# Patient Record
Sex: Male | Born: 1970 | Race: White | Hispanic: No | Marital: Single | Smoking: Never smoker
Health system: Southern US, Community
[De-identification: ages and names within clinical notes are randomized; demographics above are authoritative.]

---

## 2015-08-17 ENCOUNTER — Emergency Department (HOSPITAL_BASED_OUTPATIENT_CLINIC_OR_DEPARTMENT_OTHER)
Admission: EM | Admit: 2015-08-17 | Discharge: 2015-08-17 | Disposition: A | Discharge status: Home / Self Care | Payer: BLUE CROSS/BLUE SHIELD | Source: Home / Self Care | Attending: Emergency Medicine | Admitting: Emergency Medicine

## 2015-08-17 ENCOUNTER — Other Ambulatory Visit: Payer: Self-pay

## 2015-08-17 ENCOUNTER — Emergency Department (HOSPITAL_BASED_OUTPATIENT_CLINIC_OR_DEPARTMENT_OTHER): Payer: BLUE CROSS/BLUE SHIELD

## 2015-08-17 ENCOUNTER — Encounter (HOSPITAL_BASED_OUTPATIENT_CLINIC_OR_DEPARTMENT_OTHER): Payer: Self-pay | Admitting: Emergency Medicine

## 2015-08-17 DIAGNOSIS — J989 Respiratory disorder, unspecified: Secondary | ICD-10-CM | POA: Insufficient documentation

## 2015-08-17 DIAGNOSIS — B9789 Other viral agents as the cause of diseases classified elsewhere: Secondary | ICD-10-CM

## 2015-08-17 DIAGNOSIS — J988 Other specified respiratory disorders: Principal | ICD-10-CM

## 2015-08-17 DIAGNOSIS — R079 Chest pain, unspecified: Secondary | ICD-10-CM | POA: Diagnosis not present

## 2015-08-17 DIAGNOSIS — A4102 Sepsis due to Methicillin resistant Staphylococcus aureus: Secondary | ICD-10-CM | POA: Diagnosis not present

## 2015-08-17 LAB — CBC WITH DIFFERENTIAL/PLATELET
BASOS PCT: 0 %
Basophils Absolute: 0 10*3/uL (ref 0.0–0.1)
EOS ABS: 0 10*3/uL (ref 0.0–0.7)
Eosinophils Relative: 0 %
HEMATOCRIT: 39.1 % (ref 39.0–52.0)
Hemoglobin: 13.6 g/dL (ref 13.0–17.0)
Lymphocytes Relative: 9 %
Lymphs Abs: 1 10*3/uL (ref 0.7–4.0)
MCH: 31.9 pg (ref 26.0–34.0)
MCHC: 34.8 g/dL (ref 30.0–36.0)
MCV: 91.8 fL (ref 78.0–100.0)
MONO ABS: 0.9 10*3/uL (ref 0.1–1.0)
MONOS PCT: 8 %
Neutro Abs: 9.6 10*3/uL — ABNORMAL HIGH (ref 1.7–7.7)
Neutrophils Relative %: 83 %
Platelets: 206 10*3/uL (ref 150–400)
RBC: 4.26 MIL/uL (ref 4.22–5.81)
RDW: 12.3 % (ref 11.5–15.5)
WBC: 11.5 10*3/uL — ABNORMAL HIGH (ref 4.0–10.5)

## 2015-08-17 LAB — BASIC METABOLIC PANEL
Anion gap: 9 (ref 5–15)
BUN: 18 mg/dL (ref 6–20)
CALCIUM: 8.6 mg/dL — AB (ref 8.9–10.3)
CO2: 24 mmol/L (ref 22–32)
CREATININE: 1.44 mg/dL — AB (ref 0.61–1.24)
Chloride: 98 mmol/L — ABNORMAL LOW (ref 101–111)
GFR calc Af Amer: >60 mL/min (ref >60)
GFR calc non Af Amer: 57 mL/min — ABNORMAL LOW (ref >60)
GLUCOSE: 140 mg/dL — AB (ref 65–99)
Potassium: 4 mmol/L (ref 3.5–5.1)
Sodium: 131 mmol/L — ABNORMAL LOW (ref 135–145)

## 2015-08-17 LAB — I-STAT CG4 LACTIC ACID, ED: LACTIC ACID, VENOUS: 0.69 mmol/L (ref 0.5–2.0)

## 2015-08-17 NOTE — ED Notes (Signed)
MD at bedside. 

## 2015-08-17 NOTE — ED Notes (Signed)
Onset SOB x1 day also reports recent chills and pain with deep breathing also admits to Left chest pain

## 2015-08-17 NOTE — ED Notes (Signed)
Return from xray

## 2015-08-17 NOTE — ED Provider Notes (Signed)
CSN: [id number]     Arrival date & time 08/17/15  0123 History   First MD Initiated Contact with Patient 08/17/15 0256     Chief Complaint  Patient presents with  . Shortness of Breath     (Consider location/radiation/quality/duration/timing/severity/associated sxs/prior Treatment) HPI  This is a 45 year old male with a two-day history of shortness of breath, sharp pleuritic left upper chest pain, subjective fever alternating with shaking chills, fatigue and general malaise. He has also noticed an enlarged right anterior cervical lymph node. He has been taking Tylenol and ibuprofen with some relief of his symptoms. His shortness of breath and pleuritic chest pain has significantly improved since arrival in the ED. His pain is now minimal. He denies sore throat, nausea, vomiting, diarrhea, dysuria or abdominal pain.  History reviewed. No pertinent past medical history. History reviewed. No pertinent past surgical history. History reviewed. No pertinent family history. Social History  Substance Use Topics  . Smoking status: Never Smoker   . Smokeless tobacco: Never Used  . Alcohol Use: Yes    Review of Systems  All other systems reviewed and are negative.   Allergies  Review of patient's allergies indicates no known allergies.  Home Medications   Prior to Admission medications   Not on File   BP 128/79 mmHg  Pulse 109  Temp(Src) 98.1 F (36.7 C) (Oral)  Wt 190 lb (86.183 kg)  SpO2 95%   Physical Exam  General: Well-developed, well-nourished male in no acute distress; appearance consistent with age of record HENT: normocephalic; atraumatic; no pharyngeal erythema or exudate Eyes: pupils equal, round and reactive to light; extraocular muscles intact Neck: supple; no appreciable lymphadenopathy Heart: regular rate and rhythm Lungs: clear to auscultation bilaterally Abdomen: soft; nondistended; nontender; no masses or hepatosplenomegaly; bowel sounds present Extremities:  No deformity; full range of motion; pulses normal Neurologic: Awake, alert and oriented; motor function intact in all extremities and symmetric; no facial droop Skin: Clammy Psychiatric: Normal mood and affect    ED Course  Procedures (including critical care time)   MDM  Nursing notes and vitals signs, including pulse oximetry, reviewed.  Summary of this visit's results, reviewed by myself:  EKG Interpretation:  Date & Time: 08/17/2015 1:45 AM  Rate: 96  Rhythm: normal sinus rhythm  QRS Axis: left  Intervals: normal  ST/T Wave abnormalities: normal  Conduction Disutrbances:none  Narrative Interpretation:   Old EKG Reviewed: none available   Labs:  Results for orders placed or performed during the hospital encounter of 08/17/15 (from the past 24 hour(s))  Basic metabolic panel     Status: Abnormal   Collection Time: 08/17/15  3:10 AM  Result Value Ref Range   Sodium 131 (L) 135 - 145 mmol/L   Potassium 4.0 3.5 - 5.1 mmol/L   Chloride 98 (L) 101 - 111 mmol/L   CO2 24 22 - 32 mmol/L   Glucose, Bld 140 (H) 65 - 99 mg/dL   BUN 18 6 - 20 mg/dL   Creatinine, Ser 4.091.44 (H) 0.61 - 1.24 mg/dL   Calcium 8.6 (L) 8.9 - 10.3 mg/dL   GFR calc non Af Amer 57 (L) >60 mL/min   GFR calc Af Amer >60 >60 mL/min   Anion gap 9 5 - 15  CBC with Differential/Platelet     Status: Abnormal   Collection Time: 08/17/15  3:10 AM  Result Value Ref Range   WBC 11.5 (H) 4.0 - 10.5 K/uL   RBC 4.26 4.22 - 5.81 MIL/uL  Hemoglobin 13.6 13.0 - 17.0 g/dL   HCT 69.639.1 29.539.0 - 28.452.0 %   MCV 91.8 78.0 - 100.0 fL   MCH 31.9 26.0 - 34.0 pg   MCHC 34.8 30.0 - 36.0 g/dL   RDW 13.212.3 44.011.5 - 10.215.5 %   Platelets 206 150 - 400 K/uL   Neutrophils Relative % 83 %   Neutro Abs 9.6 (H) 1.7 - 7.7 K/uL   Lymphocytes Relative 9 %   Lymphs Abs 1.0 0.7 - 4.0 K/uL   Monocytes Relative 8 %   Monocytes Absolute 0.9 0.1 - 1.0 K/uL   Eosinophils Relative 0 %   Eosinophils Absolute 0.0 0.0 - 0.7 K/uL   Basophils Relative 0  %   Basophils Absolute 0.0 0.0 - 0.1 K/uL  I-Stat CG4 Lactic Acid, ED     Status: None   Collection Time: 08/17/15  3:21 AM  Result Value Ref Range   Lactic Acid, Venous 0.69 0.5 - 2.0 mmol/L    Imaging Studies: Dg Chest 2 View  08/17/2015  CLINICAL DATA:  Shortness of breath and LEFT chest pain for 1 day. Family history of heart disease. EXAM: CHEST  2 VIEW COMPARISON:  None. FINDINGS: Cardiomediastinal silhouette is normal. The lungs are clear without pleural effusions or focal consolidations. Trachea projects midline and there is no pneumothorax. Mildly elevated RIGHT hemidiaphragm. Nodular density in projecting in LEFT upper chest is most consistent with confluence of bony shadows. Soft tissue planes and included osseous structures are non-suspicious. IMPRESSION: No acute cardiopulmonary process. Electronically Signed   By: Awilda Metroourtnay  Bloomer M.D.   On: 08/17/2015 02:18   The patient's symptoms are consistent with a viral illness. He was advised of laboratory and x-ray findings. He was advised to get rechecked should symptoms worsen.      Chase LibraJohn Dacia Capers, MD 08/17/15 [phone]

## 2015-08-18 ENCOUNTER — Emergency Department (HOSPITAL_BASED_OUTPATIENT_CLINIC_OR_DEPARTMENT_OTHER): Payer: BLUE CROSS/BLUE SHIELD

## 2015-08-18 ENCOUNTER — Encounter (HOSPITAL_BASED_OUTPATIENT_CLINIC_OR_DEPARTMENT_OTHER): Payer: Self-pay | Admitting: Emergency Medicine

## 2015-08-18 ENCOUNTER — Inpatient Hospital Stay (HOSPITAL_BASED_OUTPATIENT_CLINIC_OR_DEPARTMENT_OTHER)
Admission: EM | Admit: 2015-08-18 | Discharge: 2015-08-24 | DRG: 871 | Disposition: A | Discharge status: Home Health | Payer: BLUE CROSS/BLUE SHIELD | Attending: Internal Medicine | Admitting: Internal Medicine

## 2015-08-18 DIAGNOSIS — L0201 Cutaneous abscess of face: Secondary | ICD-10-CM | POA: Diagnosis present

## 2015-08-18 DIAGNOSIS — R7881 Bacteremia: Secondary | ICD-10-CM | POA: Diagnosis not present

## 2015-08-18 DIAGNOSIS — R911 Solitary pulmonary nodule: Secondary | ICD-10-CM | POA: Diagnosis present

## 2015-08-18 DIAGNOSIS — J15212 Pneumonia due to Methicillin resistant Staphylococcus aureus: Secondary | ICD-10-CM

## 2015-08-18 DIAGNOSIS — R079 Chest pain, unspecified: Secondary | ICD-10-CM | POA: Diagnosis present

## 2015-08-18 DIAGNOSIS — J189 Pneumonia, unspecified organism: Secondary | ICD-10-CM | POA: Diagnosis present

## 2015-08-18 DIAGNOSIS — M109 Gout, unspecified: Secondary | ICD-10-CM | POA: Diagnosis present

## 2015-08-18 DIAGNOSIS — J9601 Acute respiratory failure with hypoxia: Secondary | ICD-10-CM

## 2015-08-18 DIAGNOSIS — L03221 Cellulitis of neck: Secondary | ICD-10-CM | POA: Diagnosis present

## 2015-08-18 DIAGNOSIS — N179 Acute kidney failure, unspecified: Secondary | ICD-10-CM | POA: Diagnosis present

## 2015-08-18 DIAGNOSIS — L0211 Cutaneous abscess of neck: Secondary | ICD-10-CM | POA: Insufficient documentation

## 2015-08-18 DIAGNOSIS — B9562 Methicillin resistant Staphylococcus aureus infection as the cause of diseases classified elsewhere: Secondary | ICD-10-CM | POA: Diagnosis not present

## 2015-08-18 DIAGNOSIS — R0781 Pleurodynia: Secondary | ICD-10-CM

## 2015-08-18 DIAGNOSIS — R599 Enlarged lymph nodes, unspecified: Secondary | ICD-10-CM | POA: Diagnosis not present

## 2015-08-18 DIAGNOSIS — J984 Other disorders of lung: Secondary | ICD-10-CM | POA: Diagnosis not present

## 2015-08-18 DIAGNOSIS — A4102 Sepsis due to Methicillin resistant Staphylococcus aureus: Principal | ICD-10-CM | POA: Diagnosis present

## 2015-08-18 DIAGNOSIS — R590 Localized enlarged lymph nodes: Secondary | ICD-10-CM | POA: Diagnosis present

## 2015-08-18 DIAGNOSIS — R001 Bradycardia, unspecified: Secondary | ICD-10-CM | POA: Diagnosis not present

## 2015-08-18 DIAGNOSIS — L988 Other specified disorders of the skin and subcutaneous tissue: Secondary | ICD-10-CM | POA: Diagnosis not present

## 2015-08-18 DIAGNOSIS — I76 Septic arterial embolism: Secondary | ICD-10-CM | POA: Diagnosis present

## 2015-08-18 DIAGNOSIS — D649 Anemia, unspecified: Secondary | ICD-10-CM | POA: Diagnosis present

## 2015-08-18 DIAGNOSIS — M25571 Pain in right ankle and joints of right foot: Secondary | ICD-10-CM | POA: Diagnosis not present

## 2015-08-18 DIAGNOSIS — E876 Hypokalemia: Secondary | ICD-10-CM | POA: Diagnosis present

## 2015-08-18 LAB — BASIC METABOLIC PANEL
ANION GAP: 9 (ref 5–15)
BUN: 15 mg/dL (ref 6–20)
CALCIUM: 8.6 mg/dL — AB (ref 8.9–10.3)
CO2: 27 mmol/L (ref 22–32)
Chloride: 96 mmol/L — ABNORMAL LOW (ref 101–111)
Creatinine, Ser: 1.33 mg/dL — ABNORMAL HIGH (ref 0.61–1.24)
Glucose, Bld: 132 mg/dL — ABNORMAL HIGH (ref 65–99)
Potassium: 4.1 mmol/L (ref 3.5–5.1)
Sodium: 132 mmol/L — ABNORMAL LOW (ref 135–145)

## 2015-08-18 LAB — CBC WITH DIFFERENTIAL/PLATELET
BASOS ABS: 0 10*3/uL (ref 0.0–0.1)
BASOS PCT: 0 %
EOS PCT: 0 %
Eosinophils Absolute: 0 10*3/uL (ref 0.0–0.7)
HEMATOCRIT: 40.2 % (ref 39.0–52.0)
Hemoglobin: 14 g/dL (ref 13.0–17.0)
Lymphocytes Relative: 10 %
Lymphs Abs: 1.2 10*3/uL (ref 0.7–4.0)
MCH: 31.9 pg (ref 26.0–34.0)
MCHC: 34.8 g/dL (ref 30.0–36.0)
MCV: 91.6 fL (ref 78.0–100.0)
MONO ABS: 1.5 10*3/uL — AB (ref 0.1–1.0)
Monocytes Relative: 13 %
NEUTROS ABS: 9.1 10*3/uL — AB (ref 1.7–7.7)
Neutrophils Relative %: 77 %
PLATELETS: 219 10*3/uL (ref 150–400)
RBC: 4.39 MIL/uL (ref 4.22–5.81)
RDW: 12.5 % (ref 11.5–15.5)
WBC: 11.9 10*3/uL — AB (ref 4.0–10.5)

## 2015-08-18 LAB — CREATININE, SERUM
Creatinine, Ser: 1.25 mg/dL — ABNORMAL HIGH (ref 0.61–1.24)
GFR calc Af Amer: >60 mL/min (ref >60)
GFR calc non Af Amer: >60 mL/min (ref >60)

## 2015-08-18 LAB — I-STAT CG4 LACTIC ACID, ED: Lactic Acid, Venous: 1.8 mmol/L (ref 0.5–2.0)

## 2015-08-18 LAB — CBC
HCT: 39.2 % (ref 39.0–52.0)
Hemoglobin: 13.5 g/dL (ref 13.0–17.0)
MCH: 31.7 pg (ref 26.0–34.0)
MCHC: 34.4 g/dL (ref 30.0–36.0)
MCV: 92 fL (ref 78.0–100.0)
PLATELETS: 204 10*3/uL (ref 150–400)
RBC: 4.26 MIL/uL (ref 4.22–5.81)
RDW: 12.9 % (ref 11.5–15.5)
WBC: 12.8 10*3/uL — ABNORMAL HIGH (ref 4.0–10.5)

## 2015-08-18 MED ORDER — CEFEPIME HCL 1 G IJ SOLR
INTRAMUSCULAR | Status: AC
  Filled 2015-08-18: qty 1

## 2015-08-18 MED ORDER — DEXTROSE 5 % IV SOLN
1.0000 g | Freq: Once | INTRAVENOUS | Status: AC
  Administered 2015-08-18: 1 g via INTRAVENOUS

## 2015-08-18 MED ORDER — WHITE PETROLATUM GEL
Status: AC
  Administered 2015-08-18
  Filled 2015-08-18: qty 1

## 2015-08-18 MED ORDER — ACETAMINOPHEN 325 MG PO TABS
650.0000 mg | ORAL_TABLET | Freq: Four times a day (QID) | ORAL | Status: DC | PRN
  Administered 2015-08-19 – 2015-08-22 (×6): 650 mg via ORAL
  Filled 2015-08-18 (×5): qty 2

## 2015-08-18 MED ORDER — IBUPROFEN 600 MG PO TABS
600.0000 mg | ORAL_TABLET | Freq: Four times a day (QID) | ORAL | Status: DC | PRN
  Administered 2015-08-18: 600 mg via ORAL
  Filled 2015-08-18: qty 1

## 2015-08-18 MED ORDER — ACETAMINOPHEN 500 MG PO TABS
1000.0000 mg | ORAL_TABLET | Freq: Once | ORAL | Status: AC
  Administered 2015-08-18: 1000 mg via ORAL
  Filled 2015-08-18: qty 2

## 2015-08-18 MED ORDER — ENOXAPARIN SODIUM 40 MG/0.4ML ~~LOC~~ SOLN
40.0000 mg | SUBCUTANEOUS | Status: DC
  Administered 2015-08-18 – 2015-08-23 (×5): 40 mg via SUBCUTANEOUS
  Filled 2015-08-18 (×5): qty 0.4

## 2015-08-18 MED ORDER — HEPARIN SODIUM (PORCINE) 5000 UNIT/ML IJ SOLN: 5000.0000 [IU] | Freq: Three times a day (TID) | INTRAMUSCULAR | Status: DC

## 2015-08-18 MED ORDER — IOPAMIDOL (ISOVUE-300) INJECTION 61%
75.0000 mL | Freq: Once | INTRAVENOUS | Status: AC | PRN
  Administered 2015-08-18: 75 mL via INTRAVENOUS

## 2015-08-18 MED ORDER — IBUPROFEN 400 MG PO TABS
600.0000 mg | ORAL_TABLET | Freq: Once | ORAL | Status: AC
  Administered 2015-08-18: 600 mg via ORAL
  Filled 2015-08-18: qty 1

## 2015-08-18 MED ORDER — SODIUM CHLORIDE 0.9 % IV BOLUS (SEPSIS)
1000.0000 mL | Freq: Once | INTRAVENOUS | Status: AC
  Administered 2015-08-18: 1000 mL via INTRAVENOUS

## 2015-08-18 MED ORDER — CLINDAMYCIN PHOSPHATE 600 MG/50ML IV SOLN
600.0000 mg | Freq: Once | INTRAVENOUS | Status: DC
Start: 2015-08-18 — End: 2015-08-18
  Filled 2015-08-18: qty 50

## 2015-08-18 MED ORDER — CEFEPIME HCL 1 G IJ SOLR
1.0000 g | Freq: Three times a day (TID) | INTRAMUSCULAR | Status: DC
  Administered 2015-08-18 – 2015-08-20 (×5): 1 g via INTRAVENOUS
  Filled 2015-08-18 (×8): qty 1

## 2015-08-18 MED ORDER — VANCOMYCIN HCL IN DEXTROSE 1-5 GM/200ML-% IV SOLN
1000.0000 mg | Freq: Two times a day (BID) | INTRAVENOUS | Status: DC
  Administered 2015-08-18 – 2015-08-22 (×8): 1000 mg via INTRAVENOUS
  Filled 2015-08-18 (×9): qty 200

## 2015-08-18 MED ORDER — DEXTROSE 5 % IV SOLN: 1.0000 g | Freq: Two times a day (BID) | INTRAVENOUS | Status: DC

## 2015-08-18 NOTE — ED Notes (Signed)
Patient states that he was here 2 days ago with SOB  - was dx with an URI but not given any meds. Since then he has had some neck swelling and is still very SOB

## 2015-08-18 NOTE — ED Provider Notes (Signed)
CSN: [id number]     Arrival date & time 08/18/15  [id number] History   First MD Initiated Contact with Patient 08/18/15 [phone]     Chief Complaint  Patient presents with  . Shortness of Breath     (Consider location/radiation/quality/duration/timing/severity/associated sxs/prior Treatment) HPI   Patient is a 45 year old male with no pertinent past medical history who presents the ED with complaint of neck swelling, onset 2 days. Patient reports he began having a small amount of swelling to the left side of his anterior neck which she notes has worsened over the past few days. He reports "it feels like a golf ball is in my neck". Denies drainage. Endorses associated fever and shortness of breath due to "neck tightness". Patient also reports having sharp left-sided chest pain with deep breathing. He notes he was seen in the ED 2 days ago for similar symptoms and was diagnosed with URI and sent home with symptomatically treatment. He states he has been taking Tylenol and ibuprofen at home without relief. Denies headache, lightheadedness, dizziness, nasal congestion, rhinorrhea, sore throat, dysphasia, drooling, trismus, muffled voice, wheezing, cough, palpitations, abdominal pain, nausea, vomiting. Last dose of tylenol at 6am.  History reviewed. No pertinent past medical history. History reviewed. No pertinent past surgical history. History reviewed. No pertinent family history. Social History  Substance Use Topics  . Smoking status: Never Smoker   . Smokeless tobacco: Never Used  . Alcohol Use: Yes    Review of Systems  Constitutional: Positive for fever.  Respiratory: Positive for shortness of breath.   Cardiovascular: Positive for chest pain.  Musculoskeletal: Positive for neck pain (swelling).      Allergies  Review of patient's allergies indicates no known allergies.  Home Medications   Prior to Admission medications   Not on File   BP 122/77 mmHg  Pulse 86  Temp(Src) 98.5 F  (36.9 C) (Axillary)  Resp 20  Ht 5\' 11"  (1.803 m)  Wt 86.183 kg  BMI 26.51 kg/m2  SpO2 96% Physical Exam  Constitutional: He is oriented to person, place, and time. He appears well-developed and well-nourished.  HENT:  Head: Normocephalic and atraumatic.  Nose: Nose normal. Right sinus exhibits no maxillary sinus tenderness and no frontal sinus tenderness. Left sinus exhibits no maxillary sinus tenderness and no frontal sinus tenderness.  Mouth/Throat: Uvula is midline, oropharynx is clear and moist and mucous membranes are normal. No oral lesions. No trismus in the jaw. Normal dentition. No dental abscesses, uvula swelling or dental caries. No oropharyngeal exudate, posterior oropharyngeal edema, posterior oropharyngeal erythema or tonsillar abscesses.  Eyes: Conjunctivae and EOM are normal. Pupils are equal, round, and reactive to light. Right eye exhibits no discharge. Left eye exhibits no discharge. No scleral icterus.  Neck: Normal range of motion. Neck supple.    Moderate swelling with surrounding erythema noted to anterior superior neck with induration, no fluctuance or drainage noted. Mild swelling with erythema and induration noted to right anterior chin with small amount of honey-colored crusting noted to inferior chin, no fluctuance or drainage noted.  Cardiovascular: Regular rhythm, normal heart sounds and intact distal pulses.   Tachycardic, HR 106  Pulmonary/Chest: Effort normal and breath sounds normal. No respiratory distress. He has no wheezes. He has no rales. He exhibits no tenderness.  Abdominal: Soft. Bowel sounds are normal. He exhibits no distension and no mass. There is no tenderness. There is no rebound and no guarding.  Musculoskeletal: He exhibits no edema.  Lymphadenopathy:    He has  cervical adenopathy (left submanbidular).  Neurological: He is alert and oriented to person, place, and time.  Skin: Skin is warm and dry.  Nursing note and vitals reviewed.   ED  Course  Procedures (including critical care time) Labs Review Labs Reviewed  CBC WITH DIFFERENTIAL/PLATELET - Abnormal; Notable for the following:    WBC 11.9 (*)    Neutro Abs 9.1 (*)    Monocytes Absolute 1.5 (*)    All other components within normal limits  BASIC METABOLIC PANEL - Abnormal; Notable for the following:    Sodium 132 (*)    Chloride 96 (*)    Glucose, Bld 132 (*)    Creatinine, Ser 1.33 (*)    Calcium 8.6 (*)    All other components within normal limits  CBC  CREATININE, SERUM  I-STAT CG4 LACTIC ACID, ED    Imaging Review Dg Chest 2 View  08/17/2015  CLINICAL DATA:  Shortness of breath and LEFT chest pain for 1 day. Family history of heart disease. EXAM: CHEST  2 VIEW COMPARISON:  None. FINDINGS: Cardiomediastinal silhouette is normal. The lungs are clear without pleural effusions or focal consolidations. Trachea projects midline and there is no pneumothorax. Mildly elevated RIGHT hemidiaphragm. Nodular density in projecting in LEFT upper chest is most consistent with confluence of bony shadows. Soft tissue planes and included osseous structures are non-suspicious. IMPRESSION: No acute cardiopulmonary process. Electronically Signed   By: Awilda Metro M.D.   On: 08/17/2015 02:18   Ct Soft Tissue Neck W Contrast  08/18/2015  CLINICAL DATA:  Neck swelling. Recent upper respiratory infection. Elevated white blood cell count. Evaluate for abscess. EXAM: CT NECK WITH CONTRAST TECHNIQUE: Multidetector CT imaging of the neck was performed using the standard protocol following the bolus administration of intravenous contrast. CONTRAST:  75mL ISOVUE-300 IOPAMIDOL (ISOVUE-300) INJECTION 61% COMPARISON:  None. FINDINGS: Pharynx and larynx: There is symmetric, mild adenoid and moderate palatine tonsillar enlargement without a discrete pharyngeal mass identified. No peritonsillar or retropharyngeal fluid collection is seen. The larynx is unremarkable allowing for mild motion  artifact. Salivary glands: The parotid and submandibular glands are unremarkable. There is moderate diffuse subcutaneous fat reticulation anterior and inferior to the mandible bilaterally with skin thickening and thickening of the platysma. Inflammatory change is greatest just right of midline in the submental region. No fluid collection is identified. Thyroid: Unremarkable. Lymph nodes: Submental lymph nodes measure up to 7 mm in short axis. There also subcentimeter submandibular lymph nodes bilaterally. Bilateral level II lymph nodes measure up to 10 mm in short axis, and there small bilateral level III and IV lymph nodes. These are all likely reactive. Vascular: Major vascular structures of the neck appear patent. Limited intracranial: Unremarkable. Visualized orbits: Unremarkable. Mastoids and visualized paranasal sinuses: Clear. Skeleton: Mild cervical spondylosis and lower cervical/ upper thoracic facet arthrosis. Upper chest: Multiple patchy/nodular opacities are partially visualized in the periphery of both upper lungs measuring up to 2 cm on the right and 3 cm on the left. There is a 2.2 cm nodule in the peripheral left upper lobe with small focus of central cavitation. IMPRESSION: 1. Moderate subcutaneous inflammation in the jaw region suggestive of cellulitis. No abscess. 2. The mild cervical lymphadenopathy, likely reactive. 3. Moderate symmetric tonsillar enlargement without focal mass or abscess. 4. Partially visualized nodular opacities throughout both upper lungs with at least one small focus of cavitation. These are most likely infectious and could reflect bacterial pneumonia, fungal infection, or septic emboli. Electronically Signed  By: Sebastian AcheAllen  Grady M.D.   On: 08/18/2015 11:26   I have personally reviewed and evaluated these images and lab results as part of my medical decision-making.   EKG Interpretation None      MDM   Final diagnoses:  Cellulitis of neck  Community acquired  pneumonia    Patient presents with worsening neck swelling with associated shortness of breath and intermittent left-sided pleuritic chest pain. He notes he was seen in the ED 2 days ago and diagnosed with a URI. Endorses associated fever. Temp 101.7, HR 113, remaining vitals stable. Exam revealed moderate swelling with surrounding erythema to anterior superior neck/lower jaw with induration, no fluctuance or drainage. No stridor, wheezing or signs of respiratory distress on exam. Lungs clear to auscultation bilaterally. Left submandibular lymphadenopathy present. Remaining exam unremarkable. Patient given IV fluids and Motrin. Plan to order CT soft tissue neck for evaluation of soft tissue infection/abscess. WBC 11.9. Lactate 1.8. CT revealed moderate subcutaneous inflammation in the jaw region suggestive of cellulitis, no abscess. Mild cervical lymphadenopathy, moderate symmetric tonsillar enlargement without mass or abscess. Nodular opacities visualized throughout both upper lungs with at least small focus of cavitation thought likely to be infectious. Patient started on IV antibiotics for cellulitis and suspected pneumonia. Discussed results with patient. On reevaluation, patient appears to be resting comfortably, temp 101.8, heart rate 107. Plan to admit patient for further IV antibiotics and observation. Consulted hospitalized, Dr. Julien NordmannLangeland agrees to admission. Orders placed for transfer and [facility]. Discussed plan for transfer at an admission with patient.       Satira Sarkicole Elizabeth SargentNadeau, [place] JerseyPA-C 08/18/15 1650  Rolan BuccoMelanie Belfi, MD 09/05/15 1104

## 2015-08-18 NOTE — H&P (Signed)
[facility] History and Physical  Chase Stuart UJW:[id number] DOB: [birth year] DOA: 08/18/2015  Referring physician: outside ED/[facility] PCP: No PCP Per Patient   Chief Complaint: sob  HPI: Chase RougeOliver Reigel is a 45 y.o. male  with no significant past medical history presented to outside ER for swollen neck, as well as some mild shortness of breath. He states he had some subjective fevers and chills for last 3 or 4 days was as well as some pleuritic left-sided chest pains for the last 3 or 4 days.  He was seen by the outside ED ED about 2 days ago for similar symptoms and they diagnosed him with a upper respiratory infection and sent him home with symptomatic treatment. Of note, he said last 2 days he noted a small area of redness little redness below his chin. However, when he woke up this morning, it was quite large and finally came into the ED for further evaluation.  Pt was found to have neck cellulitis and possible pneumonia in outside ER, trx w/ cefepime and vancomycin, and trx to our hospital for further iv abx in inpt setting.  Pt states he is currently feeling well, has very minimal pain in the neck region. Denies any shortness of breath, some pain with deep inhalation, but otherwise no dyspnea on exertion, no shortness of breath and very comfortable.    Review of Systems:  Per HPI, o/w all systems reviewed and neg.  PMHx/ none.  History reviewed. No pertinent past medical history. History reviewed. No pertinent past surgical history. Social History:  reports that he has never smoked. He has never used smokeless tobacco. He reports that he drinks alcohol. He reports that he does not use illicit drugs.  No Known Allergies  Family hx: reviewed, noncontributory.  Prior to Admission medications   Not on File   Physical Exam: Filed Vitals:   08/18/15 1214 08/18/15 1216 08/18/15 1540 08/18/15 1652  BP: 104/74  122/77 116/71  Pulse: 107  86 94  Temp:  101.8 F (38.8 C) 98.5  F (36.9 C) 98.1 F (36.7 C)  TempSrc:  Axillary  Oral  Resp: 22  20 20   Height:    5\' 11"  (1.803 m)  Weight:    84 kg (185 lb 3 oz)  SpO2: 93%  96% 95%    Wt Readings from Last 3 Encounters:  08/18/15 84 kg (185 lb 3 oz)  08/17/15 86.183 kg (190 lb)    General:  Appears calm and comfortable, pleasant, NAD, AAOx3 Eyes: PERRL, normal lids, irises & conjunctiva ENT: grossly normal hearing, lips & tongue, mmm Neck: large area of induration over entire chin and neck region, erythema, and enlarged left submandibular gland.  No fluctulance, nttp. Cardiovascular: RRR, no m/r/g. No LE edema. Respiratory: CTA bilaterally, no w/r/r. Normal respiratory effort. Abdomen: soft, ntnd Skin: no rash or induration seen on limited exam Musculoskeletal: grossly normal tone BUE/BLE Psychiatric: grossly normal mood and affect, speech fluent and appropriate Neurologic: grossly non-focal.          Labs on Admission:  Basic Metabolic Panel:  Recent Labs Lab 08/17/15 0310 08/18/15 1015 08/18/15 1648  NA 131* 132*  --   K 4.0 4.1  --   CL 98* 96*  --   CO2 24 27  --   GLUCOSE 140* 132*  --   BUN 18 15  --   CREATININE 1.44* 1.33* 1.25*  CALCIUM 8.6* 8.6*  --    Liver Function Tests: No results for input(s):  AST, ALT, ALKPHOS, BILITOT, PROT, ALBUMIN in the last 168 hours. No results for input(s): LIPASE, AMYLASE in the last 168 hours. No results for input(s): AMMONIA in the last 168 hours. CBC:  Recent Labs Lab 08/17/15 0310 08/18/15 1015 08/18/15 1648  WBC 11.5* 11.9* 12.8*  NEUTROABS 9.6* 9.1*  --   HGB 13.6 14.0 13.5  HCT 39.1 40.2 39.2  MCV 91.8 91.6 92.0  PLT 206 219 204   Cardiac Enzymes: No results for input(s): CKTOTAL, CKMB, CKMBINDEX, TROPONINI in the last 168 hours.  BNP (last 3 results) No results for input(s): BNP in the last 8760 hours.  ProBNP (last 3 results) No results for input(s): PROBNP in the last 8760 hours.  CBG: No results for input(s): GLUCAP in  the last 168 hours.  Radiological Exams on Admission: Dg Chest 2 View  08/17/2015  CLINICAL DATA:  Shortness of breath and LEFT chest pain for 1 day. Family history of heart disease. EXAM: CHEST  2 VIEW COMPARISON:  None. FINDINGS: Cardiomediastinal silhouette is normal. The lungs are clear without pleural effusions or focal consolidations. Trachea projects midline and there is no pneumothorax. Mildly elevated RIGHT hemidiaphragm. Nodular density in projecting in LEFT upper chest is most consistent with confluence of bony shadows. Soft tissue planes and included osseous structures are non-suspicious. IMPRESSION: No acute cardiopulmonary process. Electronically Signed   By: Awilda Metroourtnay  Bloomer M.D.   On: 08/17/2015 02:18   Ct Soft Tissue Neck W Contrast  08/18/2015  CLINICAL DATA:  Neck swelling. Recent upper respiratory infection. Elevated white blood cell count. Evaluate for abscess. EXAM: CT NECK WITH CONTRAST TECHNIQUE: Multidetector CT imaging of the neck was performed using the standard protocol following the bolus administration of intravenous contrast. CONTRAST:  75mL ISOVUE-300 IOPAMIDOL (ISOVUE-300) INJECTION 61% COMPARISON:  None. FINDINGS: Pharynx and larynx: There is symmetric, mild adenoid and moderate palatine tonsillar enlargement without a discrete pharyngeal mass identified. No peritonsillar or retropharyngeal fluid collection is seen. The larynx is unremarkable allowing for mild motion artifact. Salivary glands: The parotid and submandibular glands are unremarkable. There is moderate diffuse subcutaneous fat reticulation anterior and inferior to the mandible bilaterally with skin thickening and thickening of the platysma. Inflammatory change is greatest just right of midline in the submental region. No fluid collection is identified. Thyroid: Unremarkable. Lymph nodes: Submental lymph nodes measure up to 7 mm in short axis. There also subcentimeter submandibular lymph nodes bilaterally.  Bilateral level II lymph nodes measure up to 10 mm in short axis, and there small bilateral level III and IV lymph nodes. These are all likely reactive. Vascular: Major vascular structures of the neck appear patent. Limited intracranial: Unremarkable. Visualized orbits: Unremarkable. Mastoids and visualized paranasal sinuses: Clear. Skeleton: Mild cervical spondylosis and lower cervical/ upper thoracic facet arthrosis. Upper chest: Multiple patchy/nodular opacities are partially visualized in the periphery of both upper lungs measuring up to 2 cm on the right and 3 cm on the left. There is a 2.2 cm nodule in the peripheral left upper lobe with small focus of central cavitation. IMPRESSION: 1. Moderate subcutaneous inflammation in the jaw region suggestive of cellulitis. No abscess. 2. The mild cervical lymphadenopathy, likely reactive. 3. Moderate symmetric tonsillar enlargement without focal mass or abscess. 4. Partially visualized nodular opacities throughout both upper lungs with at least one small focus of cavitation. These are most likely infectious and could reflect bacterial pneumonia, fungal infection, or septic emboli. Electronically Signed   By: Sebastian AcheAllen  Grady M.D.   On: 08/18/2015 11:26  EKG: 08/17/15  Independently reviewed. sr 96bpm, borderline LAD, no acute st changes   Assessment/Plan Principal Problem:   Cellulitis of neck Active Problems:   Pneumonia   Reactive cervical lymphadenopathy   1. Cellulitis of neck/jaw, w/ associated reactive cervical lymphadneopathy - admit tele - no abscessed noted on ct neck, but notes moderate subcutaneous inflammation in jaw region suggestive of cellulitis. - suspect may need at least 2 days of iv abx, emp cefepime/vancomycin (which was started in outside ED, and continued)  2. CAP - Recent URI, but new findings on CT of nodular opacities throughout both upper lungs, with at least 1 small focus of cavitation. - emp treat w/ broad spect abx,  cefepime, vancomycin - pharm to assist w/ vancomycin dosing. - consider ct chest for f/u, given cxr 6/24  2v was unremarkable   Code Status: full DVT Prophylaxis: lovenox 40 scqd Family Communication: patient, no family at bedside. Disposition Plan: inpt tele.  Time spent:  Pete Glatter MD., MBA/MHA [facility] Pager [phone]

## 2015-08-18 NOTE — Progress Notes (Signed)
Pharmacy Antibiotic Note  Chase Stuart is a 45 y.o. male admitted on 08/18/2015 with pneumonia.  Pharmacy has been consulted for vancomycin dosing.  Fever of 101.8 in ED, wbc 11, CT shows jaw cellulitis, small focus of cavitation suggestive of pna.  Plan: Vancomycin 1000 IV every 12 hours.  Goal trough 15-20 mcg/mL.  Height: 5\' 11"  (180.3 cm) Weight: 190 lb (86.183 kg) IBW/kg (Calculated) : 75.3  Temp (24hrs), Avg:101.8 F (38.8 C), Min:101.7 F (38.7 C), Max:101.8 F (38.8 C)   Recent Labs Lab 08/17/15 0310 08/17/15 0321 08/18/15 1015 08/18/15 1032  WBC 11.5*  --  11.9*  --   CREATININE 1.44*  --  1.33*  --   LATICACIDVEN  --  0.69  --  1.80    Estimated Creatinine Clearance: 73.1 mL/min (by C-G formula based on Cr of 1.33).    No Known Allergies  Antimicrobials this admission: Vanc 6/25 >> Cefepime 6/25>>  Dose adjustments this admission: n/a  Microbiology results:  Thank you for allowing pharmacy to be a part of this patient's care.  Sheppard CoilFrank Wilson PharmD., BCPS Clinical Pharmacist Pager [phone] 08/18/2015 12:35 PM

## 2015-08-18 NOTE — ED Notes (Signed)
Pt on automatic VS and cardiac monitor 

## 2015-08-19 ENCOUNTER — Inpatient Hospital Stay (HOSPITAL_COMMUNITY): Payer: BLUE CROSS/BLUE SHIELD

## 2015-08-19 DIAGNOSIS — R599 Enlarged lymph nodes, unspecified: Secondary | ICD-10-CM

## 2015-08-19 LAB — CBC
HCT: 37.2 % — ABNORMAL LOW (ref 39.0–52.0)
Hemoglobin: 12.4 g/dL — ABNORMAL LOW (ref 13.0–17.0)
MCH: 30 pg (ref 26.0–34.0)
MCHC: 33.3 g/dL (ref 30.0–36.0)
MCV: 90.1 fL (ref 78.0–100.0)
PLATELETS: 216 10*3/uL (ref 150–400)
RBC: 4.13 MIL/uL — ABNORMAL LOW (ref 4.22–5.81)
RDW: 12.8 % (ref 11.5–15.5)
WBC: 12.5 10*3/uL — ABNORMAL HIGH (ref 4.0–10.5)

## 2015-08-19 LAB — BASIC METABOLIC PANEL
Anion gap: 8 (ref 5–15)
BUN: 11 mg/dL (ref 6–20)
CALCIUM: 8.3 mg/dL — AB (ref 8.9–10.3)
CO2: 25 mmol/L (ref 22–32)
CREATININE: 1.18 mg/dL (ref 0.61–1.24)
Chloride: 97 mmol/L — ABNORMAL LOW (ref 101–111)
GFR calc Af Amer: >60 mL/min (ref >60)
GLUCOSE: 124 mg/dL — AB (ref 65–99)
Potassium: 4 mmol/L (ref 3.5–5.1)
Sodium: 130 mmol/L — ABNORMAL LOW (ref 135–145)

## 2015-08-19 LAB — SEDIMENTATION RATE: Sed Rate: 48 mm/hr — ABNORMAL HIGH (ref 0–16)

## 2015-08-19 LAB — C-REACTIVE PROTEIN: CRP: 45.7 mg/dL — AB (ref <1.0)

## 2015-08-19 LAB — LACTATE DEHYDROGENASE: LDH: 179 U/L (ref 98–192)

## 2015-08-19 MED ORDER — ONDANSETRON HCL 4 MG/2ML IJ SOLN: 4.0000 mg | Freq: Four times a day (QID) | INTRAMUSCULAR | Status: DC | PRN

## 2015-08-19 MED ORDER — SODIUM CHLORIDE 0.9 % IV BOLUS (SEPSIS)
500.0000 mL | Freq: Once | INTRAVENOUS | Status: AC
  Administered 2015-08-19: 500 mL via INTRAVENOUS

## 2015-08-19 MED ORDER — FAMOTIDINE 20 MG PO TABS
20.0000 mg | ORAL_TABLET | Freq: Every day | ORAL | Status: DC
  Administered 2015-08-19 – 2015-08-24 (×5): 20 mg via ORAL
  Filled 2015-08-19 (×5): qty 1

## 2015-08-19 MED ORDER — GUAIFENESIN ER 600 MG PO TB12
600.0000 mg | ORAL_TABLET | Freq: Two times a day (BID) | ORAL | Status: DC
  Administered 2015-08-19 – 2015-08-24 (×10): 600 mg via ORAL
  Filled 2015-08-19 (×10): qty 1

## 2015-08-19 MED ORDER — IOPAMIDOL (ISOVUE-370) INJECTION 76%
INTRAVENOUS | Status: AC
  Administered 2015-08-19: 100 mL
  Filled 2015-08-19: qty 100

## 2015-08-19 MED ORDER — SODIUM CHLORIDE 0.9 % IV SOLN
INTRAVENOUS | Status: DC
  Administered 2015-08-19 – 2015-08-20 (×3): via INTRAVENOUS

## 2015-08-19 MED ORDER — IBUPROFEN 200 MG PO TABS
200.0000 mg | ORAL_TABLET | Freq: Four times a day (QID) | ORAL | Status: DC | PRN
  Administered 2015-08-20 – 2015-08-24 (×5): 200 mg via ORAL
  Filled 2015-08-19 (×5): qty 1

## 2015-08-19 NOTE — Progress Notes (Signed)
[facility] Progress Note  Patient: Chase Stuart EGB:[id number]   PCP: No PCP Per Patient DOB: [birth year]   DOA: 08/18/2015   DOS: 08/19/2015   Date of Service: the patient was seen and examined on 08/19/2015  Subjective: Patient has intermittent fever with chills. Also has increased shortness of breath intermittently. Complaints of left-sided pleuritic chest pain. Nutrition: Tolerating oral diet  Brief hospital course: Pt. admitted on 08/18/2015, with complaint of left-sided chest pain as well as facial swelling, was found to have cavitary pneumonia, cellulitis of the face. Currently further plan is further defined etiology including IV antibiotics.  Assessment and Plan: 1. Cellulitis of neck  Cavitary lung lesions CT neck shows cervical lymphadenopathy, tonsillar enlargement, inflammation of the skin and soft tissue of the neck as well as cavitary lesions of the lung. Patient's symptom onset was fairly rapid over last 3 days and patient mentions he was relatively healthy 3 days ago. Denies any weight loss denies any night sweats denies any low-grade temperature. Denies any travel outside of [place] or exposure to patient with TB endemic countries. At this point differential for the cavitary lung lesion does not include tubular necrosis. It appears most likely acute bacterial infection probably staphylococcus. Patient will be continued on vancomycin and cefepime. Check ESR CRP blood cultures, check CT scan of the chest with contrast. Check a NA ANCA. Monitor for improvement. IV hydration will be initiated.   Pain management: When necessary Tylenol Activity: Currently dependent no requirement for physical therapy Bowel regimen: last BM prior to admission Diet: Regular diet DVT Prophylaxis: subcutaneous Heparin  Advance goals of care discussion: Full code  Family Communication: family was present at bedside, at the time of interview. The pt provided permission to discuss  medical plan with the family. Opportunity was given to ask question and all questions were answered satisfactorily.   Disposition:  Discharge to home. Expected discharge date: 08/23/2015, results of the workup  Consultants: None Procedures: None  Antibiotics: Anti-infectives    Start     Dose/Rate Route Frequency Ordered Stop   08/18/15 2200  ceFEPIme (MAXIPIME) 1 g in dextrose 5 % 50 mL IVPB  Status:  Discontinued     1 g 100 mL/hr over 30 Minutes Intravenous Every 12 hours 08/18/15 1636 08/18/15 1638   08/18/15 2100  ceFEPIme (MAXIPIME) 1 g in dextrose 5 % 50 mL IVPB     1 g 100 mL/hr over 30 Minutes Intravenous Every 8 hours 08/18/15 1638     08/18/15 1300  vancomycin (VANCOCIN) IVPB 1000 mg/200 mL premix     1,000 mg 200 mL/hr over 60 Minutes Intravenous Every 12 hours 08/18/15 1234     08/18/15 1215  ceFEPIme (MAXIPIME) 1 g in dextrose 5 % 50 mL IVPB     1 g 100 mL/hr over 30 Minutes Intravenous  Once 08/18/15 1201 08/18/15 1243   08/18/15 1207  ceFEPIme (MAXIPIME) 1 g injection    Comments:  Hodgin, [place]  : cabinet override      08/18/15 1207 08/19/15 0014   08/18/15 1145  clindamycin (CLEOCIN) IVPB 600 mg  Status:  Discontinued     600 mg 100 mL/hr over 30 Minutes Intravenous  Once 08/18/15 1134 08/18/15 1201        Intake/Output Summary (Last 24 hours) at 08/19/15 1954 Last data filed at 08/19/15 1700  Gross per 24 hour  Intake   1580 ml  Output      0 ml  Net   1580  ml   Filed Weights   08/18/15 0943 08/18/15 1652 08/18/15 2028  Weight: 86.183 kg (190 lb) 84 kg (185 lb 3 oz) 84.188 kg (185 lb 9.6 oz)    Objective: Physical Exam: Filed Vitals:   08/19/15 1034 08/19/15 1530 08/19/15 1635 08/19/15 1742  BP:    107/59  Pulse:    116  Temp: 98.4 F (36.9 C) 103.2 F (39.6 C) 102.5 F (39.2 C)   TempSrc:   Oral Oral  Resp:    20  Height:      Weight:      SpO2:    95%    General: Alert, Awake and Oriented to Time, Place and Person. Appear in  mild distress Eyes: PERRL, Conjunctiva normal ENT: Oral Mucosa clear moist. Neck: no JVD, no Abnormal Mass Or lumps Cardiovascular: S1 and S2 Present, no Murmur, Respiratory: Bilateral Air entry equal and Decreased, Clear to Auscultation, no Crackles, no wheezes Abdomen: Bowel Sound present, Soft and no tenderness Skin: no redness, no Rash  Extremities: no Pedal edema, no calf tenderness Neurologic: Grossly no focal neuro deficit. Bilaterally Equal motor strength  Data Reviewed: CBC:  Recent Labs Lab 08/17/15 0310 08/18/15 1015 08/18/15 1648 08/19/15 0803  WBC 11.5* 11.9* 12.8* 12.5*  NEUTROABS 9.6* 9.1*  --   --   HGB 13.6 14.0 13.5 12.4*  HCT 39.1 40.2 39.2 37.2*  MCV 91.8 91.6 92.0 90.1  PLT 206 219 204 599   Basic Metabolic Panel:  Recent Labs Lab 08/17/15 0310 08/18/15 1015 08/18/15 1648 08/19/15 0803  NA 131* 132*  --  130*  K 4.0 4.1  --  4.0  CL 98* 96*  --  97*  CO2 24 27  --  25  GLUCOSE 140* 132*  --  124*  BUN 18 15  --  11  CREATININE 1.44* 1.33* 1.25* 1.18  CALCIUM 8.6* 8.6*  --  8.3*    Liver Function Tests: No results for input(s): AST, ALT, ALKPHOS, BILITOT, PROT, ALBUMIN in the last 168 hours. No results for input(s): LIPASE, AMYLASE in the last 168 hours. No results for input(s): AMMONIA in the last 168 hours. Coagulation Profile: No results for input(s): INR, PROTIME in the last 168 hours. Cardiac Enzymes: No results for input(s): CKTOTAL, CKMB, CKMBINDEX, TROPONINI in the last 168 hours. BNP (last 3 results) No results for input(s): PROBNP in the last 8760 hours.  CBG: No results for input(s): GLUCAP in the last 168 hours.  Studies: Ct Angio Chest Pe W Or Wo Contrast  08/19/2015  CLINICAL DATA:  Pleuritic chest pain. Acute hypoxemic respiratory failure. Cavitary lung lesion. EXAM: CT ANGIOGRAPHY CHEST WITH CONTRAST TECHNIQUE: Multidetector CT imaging of the chest was performed using the standard protocol during bolus administration of  intravenous contrast. Multiplanar CT image reconstructions and MIPs were obtained to evaluate the vascular anatomy. CONTRAST:  100 cc Isovue 370 IV COMPARISON:  CT cervical spine performed 08/18/2015. Chest x-ray 08/17/2015. FINDINGS: Mediastinum/Nodes: Heart is normal size. Aorta is normal caliber. No mediastinal, hilar, or axillary adenopathy. Lungs/Pleura: There are bar peripheral nodular airspace opacities noted within the upper lobes bilaterally, the largest being in the right upper lobe/ apex measuring up to 2.8 cm. Small locules of gas noted centrally suggesting early cavitation. Peripheral left upper lobe nodule with early cavitation noted on image 42 measuring 2 cm. Consolidation noted in the lingula and both lower lobes. Findings are concerning for infectious process, possibly multifocal pneumonia. Septic emboli cannot be excluded. Trace left effusion.  Upper abdomen: Imaging into the upper abdomen shows no acute findings. Musculoskeletal: No acute bony abnormality. Chest wall soft tissues are unremarkable. Review of the MIP images confirms the above findings. IMPRESSION: Areas of consolidation in the lingula and both lower lobes concerning for pneumonia. Peripheral nodular opacities in the upper lobes bilaterally, some with early cavitation. These are likely infectious as well. Cannot exclude septic emboli. Electronically Signed   By: Rolm Baptise M.D.   On: 08/19/2015 15:22     Scheduled Meds: . ceFEPime (MAXIPIME) IV  1 g Intravenous Q8H  . enoxaparin (LOVENOX) injection  40 mg Subcutaneous Q24H  . famotidine  20 mg Oral Daily  . guaiFENesin  600 mg Oral BID  . vancomycin  1,000 mg Intravenous Q12H   Continuous Infusions: . sodium chloride 100 mL/hr at 08/19/15 1807   PRN Meds: acetaminophen, ibuprofen, ondansetron (ZOFRAN) IV  Time spent: 30 minutes  Author: Berle Mull, MD [facility] Pager: [phone] 08/19/2015 7:54 PM  If 7PM-7AM, please contact night-coverage at  [email or web address], password [facility]

## 2015-08-19 NOTE — Progress Notes (Signed)
Pt with oral temp of 103.0. MD notified. Tylenol given per PRN orders. Will continue to monitor. Also, airborne precautions initiated. Pt's door closed and sign on door. Awaiting negative air pressure room to be open.

## 2015-08-19 NOTE — Progress Notes (Addendum)
Pt back from CT with chills and temp 103.2. Gave tylenol. Rechecked and temp now 102.5. MD notified. Orders received.

## 2015-08-20 DIAGNOSIS — L988 Other specified disorders of the skin and subcutaneous tissue: Secondary | ICD-10-CM

## 2015-08-20 DIAGNOSIS — B9562 Methicillin resistant Staphylococcus aureus infection as the cause of diseases classified elsewhere: Secondary | ICD-10-CM

## 2015-08-20 DIAGNOSIS — R7881 Bacteremia: Secondary | ICD-10-CM | POA: Diagnosis present

## 2015-08-20 DIAGNOSIS — J984 Other disorders of lung: Secondary | ICD-10-CM

## 2015-08-20 LAB — CBC WITH DIFFERENTIAL/PLATELET
Basophils Absolute: 0 10*3/uL (ref 0.0–0.1)
Basophils Relative: 0 %
Eosinophils Absolute: 0.1 10*3/uL (ref 0.0–0.7)
Eosinophils Relative: 1 %
HCT: 34.7 % — ABNORMAL LOW (ref 39.0–52.0)
HEMOGLOBIN: 11.9 g/dL — AB (ref 13.0–17.0)
LYMPHS PCT: 10 %
Lymphs Abs: 1.3 10*3/uL (ref 0.7–4.0)
MCH: 31.4 pg (ref 26.0–34.0)
MCHC: 34.3 g/dL (ref 30.0–36.0)
MCV: 91.6 fL (ref 78.0–100.0)
Monocytes Absolute: 1 10*3/uL (ref 0.1–1.0)
Monocytes Relative: 7 %
NEUTROS ABS: 10.8 10*3/uL — AB (ref 1.7–7.7)
Neutrophils Relative %: 82 %
Platelets: 211 10*3/uL (ref 150–400)
RBC: 3.79 MIL/uL — AB (ref 4.22–5.81)
RDW: 13 % (ref 11.5–15.5)
WBC: 13.1 10*3/uL — AB (ref 4.0–10.5)

## 2015-08-20 LAB — COMPREHENSIVE METABOLIC PANEL
ALK PHOS: 76 U/L (ref 38–126)
ALT: 21 U/L (ref 17–63)
AST: 16 U/L (ref 15–41)
Albumin: 2.5 g/dL — ABNORMAL LOW (ref 3.5–5.0)
Anion gap: 8 (ref 5–15)
BUN: 9 mg/dL (ref 6–20)
CALCIUM: 8.4 mg/dL — AB (ref 8.9–10.3)
CO2: 25 mmol/L (ref 22–32)
CREATININE: 1.11 mg/dL (ref 0.61–1.24)
Chloride: 102 mmol/L (ref 101–111)
Glucose, Bld: 123 mg/dL — ABNORMAL HIGH (ref 65–99)
Potassium: 3.5 mmol/L (ref 3.5–5.1)
SODIUM: 135 mmol/L (ref 135–145)
Total Bilirubin: 0.7 mg/dL (ref 0.3–1.2)
Total Protein: 5.8 g/dL — ABNORMAL LOW (ref 6.5–8.1)

## 2015-08-20 LAB — BLOOD CULTURE ID PANEL (REFLEXED)
Acinetobacter baumannii: NOT DETECTED
CANDIDA ALBICANS: NOT DETECTED
CANDIDA GLABRATA: NOT DETECTED
CANDIDA KRUSEI: NOT DETECTED
CANDIDA PARAPSILOSIS: NOT DETECTED
CANDIDA TROPICALIS: NOT DETECTED
Carbapenem resistance: NOT DETECTED
ENTEROBACTER CLOACAE COMPLEX: NOT DETECTED
ENTEROCOCCUS SPECIES: NOT DETECTED
Enterobacteriaceae species: NOT DETECTED
Escherichia coli: NOT DETECTED
Haemophilus influenzae: NOT DETECTED
KLEBSIELLA OXYTOCA: NOT DETECTED
KLEBSIELLA PNEUMONIAE: NOT DETECTED
Listeria monocytogenes: NOT DETECTED
Methicillin resistance: DETECTED — AB
Neisseria meningitidis: NOT DETECTED
PROTEUS SPECIES: NOT DETECTED
Pseudomonas aeruginosa: NOT DETECTED
STREPTOCOCCUS PNEUMONIAE: NOT DETECTED
Serratia marcescens: NOT DETECTED
Staphylococcus aureus (BCID): DETECTED — AB
Staphylococcus species: DETECTED — AB
Streptococcus agalactiae: NOT DETECTED
Streptococcus pyogenes: NOT DETECTED
Streptococcus species: NOT DETECTED
VANCOMYCIN RESISTANCE: NOT DETECTED

## 2015-08-20 LAB — ANCA TITERS: C-ANCA: <1:20 {titer}

## 2015-08-20 LAB — MAGNESIUM: Magnesium: 2 mg/dL (ref 1.7–2.4)

## 2015-08-20 LAB — ANTINUCLEAR ANTIBODIES, IFA: ANTINUCLEAR ANTIBODIES, IFA: NEGATIVE

## 2015-08-20 LAB — STREP PNEUMONIAE URINARY ANTIGEN: STREP PNEUMO URINARY ANTIGEN: NEGATIVE

## 2015-08-20 LAB — HIV ANTIBODY (ROUTINE TESTING W REFLEX): HIV SCREEN 4TH GENERATION: NONREACTIVE

## 2015-08-20 NOTE — Progress Notes (Signed)
  PHARMACY - PHYSICIAN COMMUNICATION CRITICAL VALUE ALERT - BLOOD CULTURE IDENTIFICATION (BCID)  Results for orders placed or performed during the hospital encounter of 08/18/15  Blood Culture ID Panel (Reflexed) (Collected: 08/19/2015  1:48 PM)  Result Value Ref Range   Enterococcus species NOT DETECTED NOT DETECTED   Vancomycin resistance NOT DETECTED NOT DETECTED   Listeria monocytogenes NOT DETECTED NOT DETECTED   Staphylococcus species DETECTED (A) NOT DETECTED   Staphylococcus aureus DETECTED (A) NOT DETECTED   Methicillin resistance DETECTED (A) NOT DETECTED   Streptococcus species NOT DETECTED NOT DETECTED   Streptococcus agalactiae NOT DETECTED NOT DETECTED   Streptococcus pneumoniae NOT DETECTED NOT DETECTED   Streptococcus pyogenes NOT DETECTED NOT DETECTED   Acinetobacter baumannii NOT DETECTED NOT DETECTED   Enterobacteriaceae species NOT DETECTED NOT DETECTED   Enterobacter cloacae complex NOT DETECTED NOT DETECTED   Escherichia coli NOT DETECTED NOT DETECTED   Klebsiella oxytoca NOT DETECTED NOT DETECTED   Klebsiella pneumoniae NOT DETECTED NOT DETECTED   Proteus species NOT DETECTED NOT DETECTED   Serratia marcescens NOT DETECTED NOT DETECTED   Carbapenem resistance NOT DETECTED NOT DETECTED   Haemophilus influenzae NOT DETECTED NOT DETECTED   Neisseria meningitidis NOT DETECTED NOT DETECTED   Pseudomonas aeruginosa NOT DETECTED NOT DETECTED   Candida albicans NOT DETECTED NOT DETECTED   Candida glabrata NOT DETECTED NOT DETECTED   Candida krusei NOT DETECTED NOT DETECTED   Candida parapsilosis NOT DETECTED NOT DETECTED   Candida tropicalis NOT DETECTED NOT DETECTED    Name of physician (or Provider) Contacted: Dr. Allena KatzPatel and Dr. Drue SecondSnider of ID  Changes to prescribed antibiotics required: MRSA bacteremia. On Vanc + Cefepime. ID will see.   Cassie L. Roseanne RenoStewart, PharmD PGY2 Infectious Diseases Pharmacy Resident Pager: [phone] 08/20/2015 2:26 PM

## 2015-08-20 NOTE — Consult Note (Signed)
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 He was in good state of health until 4-5 days prior to admit where he noticed large boil to his chin but also in concert with having larger swelling beneath his jaw that was size of golfball that was tender to touch. He then noticed having pleuretic chest pain and difficulty taking deep breaths. He called ems for evaluation who ruled out ACS but recommended to go to urgent care for eval for possible infection. He was diagnosed for uri at urgent care and not given any antibiotics at that time. Over the course of the next 3 days, he had fever, chills, rigors, diaphoresis with worsening pulmonary symptoms, particularly left sided chest pain as well as lesion under his jaw growing in size. He came to the ED on 6/25 for evaluation. He was found to have fever as high as 101.67F, his exam was remarkable for enlarged submandibular gland, induration to chin, no fluctuance, his labs revealed wbc of 12.8. Chest CT showed numerous nodules, a few with early cavitation in LUL and lingula. CT of soft tissue showed inflammation submandibular.he was empirically started on vanco/cefepime. Blood cx were drawn one day after abtx were initiated and he was found to have mrsa isolated in 1 of 2 sets of blood cx.  Patient has no history of scratch, skin infection prior to this episode. Not known to have mrsa skin infection but does get the occasional furuncle. No recent dental work.  History reviewed. No pertinent past medical history.  Allergies: No Known  Allergies   MEDICATIONS: . enoxaparin (LOVENOX) injection  40 mg Subcutaneous Q24H  . famotidine  20 mg Oral Daily  . guaiFENesin  600 mg Oral BID  . vancomycin  1,000 mg Intravenous Q12H    Social History  Substance Use Topics  . Smoking status: Never Smoker   . Smokeless tobacco: Never Used  . Alcohol Use: Yes    History reviewed. No pertinent family history.  Review of Systems  Constitutional: positive for fever, chills, diaphoresis, activity change, appetite change, fatigue and unexpected weight change.  HENT: Negative for congestion, sore throat, rhinorrhea, sneezing, trouble swallowing and sinus pressure.  Eyes: Negative for photophobia and visual disturbance.  Respiratory: Negative for cough, chest tightness, shortness of breath, wheezing and stridor.  Cardiovascular: positive for chest pain, palpitations and leg swelling.  Gastrointestinal: Negative for nausea, vomiting, abdominal pain, diarrhea, constipation, blood in stool, abdominal distention and anal bleeding.  Genitourinary: Negative for dysuria, hematuria, flank pain and difficulty urinating.  Musculoskeletal: Negative for myalgias, back pain, joint swelling, arthralgias and gait problem.  Skin: positive for skin lesion. Negative for color change, pallor, rash and wound.  Neurological: Negative for dizziness, tremors, weakness and light-headedness.  Hematological: Negative for adenopathy. Does not bruise/bleed easily.  Psychiatric/Behavioral: Negative for behavioral problems, confusion, sleep disturbance, dysphoric mood, decreased concentration and agitation.     OBJECTIVE: Temp:  [98.1 F (36.7 C)-102.2 F (  39 C)] 99.6 F (37.6 C) (06/27 1609) Pulse Rate:  [76-116] 90 (06/27 1609) Resp:  [18-20] 18 (06/27 1609) BP: (107-126)/(59-84) 126/79 mmHg (06/27 1609) SpO2:  [95 %-99 %] 99 % (06/27 1609) Weight:  [202 lb 13.2 oz (92 kg)] 202 lb 13.2 oz (92 kg) (06/26 2017) Physical Exam  Constitutional: He is  oriented to person, place, and time. He appears well-developed and well-nourished. No distress.  HENT:  Mouth/Throat: Oropharynx is clear and moist. No oropharyngeal exudate.  Cardiovascular: Normal rate, regular rhythm and normal heart sounds. Exam reveals no gallop and no friction rub.  No murmur heard.  Pulmonary/Chest: Effort normal and breath sounds normal. No respiratory distress. He has no wheezes. Cough with deep inspiration Abdominal: Soft. Bowel sounds are normal. He exhibits no distension. There is no tenderness.  Lymphadenopathy:  He has no cervical adenopathy.  Neurological: He is alert and oriented to person, place, and time.  Skin: right side of chin has indurated area, tender, red, no fluctuance. Submandibular/submental area has indurated firm, well circumscribed lesion Psychiatric: He has a normal mood and affect. His behavior is normal.      LABS: Results for orders placed or performed during the hospital encounter of 08/18/15 (from the past 48 hour(s))  Basic metabolic panel     Status: Abnormal   Collection Time: 08/19/15  8:03 AM  Result Value Ref Range   Sodium 130 (L) 135 - 145 mmol/L   Potassium 4.0 3.5 - 5.1 mmol/L   Chloride 97 (L) 101 - 111 mmol/L   CO2 25 22 - 32 mmol/L   Glucose, Bld 124 (H) 65 - 99 mg/dL   BUN 11 6 - 20 mg/dL   Creatinine, Ser 1.18 0.61 - 1.24 mg/dL   Calcium 8.3 (L) 8.9 - 10.3 mg/dL   GFR calc non Af Amer >60 >60 mL/min   GFR calc Af Amer >60 >60 mL/min    Comment: (NOTE) The eGFR has been calculated using the CKD EPI equation. This calculation has not been validated in all clinical situations. eGFR's persistently <60 mL/min signify possible Chronic Kidney Disease.    Anion gap 8 5 - 15  CBC     Status: Abnormal   Collection Time: 08/19/15  8:03 AM  Result Value Ref Range   WBC 12.5 (H) 4.0 - 10.5 K/uL   RBC 4.13 (L) 4.22 - 5.81 MIL/uL   Hemoglobin 12.4 (L) 13.0 - 17.0 g/dL   HCT 37.2 (L) 39.0 - 52.0 %   MCV 90.1 78.0 -  100.0 fL   MCH 30.0 26.0 - 34.0 pg   MCHC 33.3 30.0 - 36.0 g/dL   RDW 12.8 11.5 - 15.5 %   Platelets 216 150 - 400 K/uL  C-reactive protein     Status: Abnormal   Collection Time: 08/19/15  1:34 PM  Result Value Ref Range   CRP 45.7 (H) <1.0 mg/dL  Lactate dehydrogenase     Status: None   Collection Time: 08/19/15  1:34 PM  Result Value Ref Range   LDH 179 98 - 192 U/L  Culture, blood (routine x 2) Call MD if unable to obtain prior to antibiotics being given     Status: None (Preliminary result)   Collection Time: 08/19/15  1:48 PM  Result Value Ref Range   Specimen Description BLOOD RIGHT HAND    Special Requests IN PEDIATRIC BOTTLE 1 ML    Culture  Setup Time      GRAM POSITIVE COCCI IN CLUSTERS IN  PEDIATRIC BOTTLE Organism ID to follow CRITICAL RESULT CALLED TO, READ BACK BY AND VERIFIED WITH: CLamont Snowball.D. 14:15 08/20/15  ([email or web address])    Culture NO GROWTH 1 DAY    Report Status PENDING   Blood Culture ID Panel (Reflexed)     Status: Abnormal   Collection Time: 08/19/15  1:48 PM  Result Value Ref Range   Enterococcus species NOT DETECTED NOT DETECTED   Vancomycin resistance NOT DETECTED NOT DETECTED   Listeria monocytogenes NOT DETECTED NOT DETECTED   Staphylococcus species DETECTED (A) NOT DETECTED    Comment: CRITICAL RESULT CALLED TO, READ BACK BY AND VERIFIED WITH: CLamont Snowball.D. 14:15 08/20/15 ([email or web address])    Staphylococcus aureus DETECTED (A) NOT DETECTED    Comment: CRITICAL RESULT CALLED TO, READ BACK BY AND VERIFIED WITH: CLamont Snowball.D. 14:15 08/20/15 ([email or web address])    Methicillin resistance DETECTED (A) NOT DETECTED    Comment: CRITICAL RESULT CALLED TO, READ BACK BY AND VERIFIED WITH: CLamont Snowball.D. 14:15 08/20/15 ([email or web address])    Streptococcus species NOT DETECTED NOT DETECTED   Streptococcus agalactiae NOT DETECTED NOT DETECTED   Streptococcus pneumoniae NOT DETECTED NOT DETECTED   Streptococcus pyogenes NOT DETECTED NOT DETECTED   Acinetobacter  baumannii NOT DETECTED NOT DETECTED   Enterobacteriaceae species NOT DETECTED NOT DETECTED   Enterobacter cloacae complex NOT DETECTED NOT DETECTED   Escherichia coli NOT DETECTED NOT DETECTED   Klebsiella oxytoca NOT DETECTED NOT DETECTED   Klebsiella pneumoniae NOT DETECTED NOT DETECTED   Proteus species NOT DETECTED NOT DETECTED   Serratia marcescens NOT DETECTED NOT DETECTED   Carbapenem resistance NOT DETECTED NOT DETECTED   Haemophilus influenzae NOT DETECTED NOT DETECTED   Neisseria meningitidis NOT DETECTED NOT DETECTED   Pseudomonas aeruginosa NOT DETECTED NOT DETECTED   Candida albicans NOT DETECTED NOT DETECTED   Candida glabrata NOT DETECTED NOT DETECTED   Candida krusei NOT DETECTED NOT DETECTED   Candida parapsilosis NOT DETECTED NOT DETECTED   Candida tropicalis NOT DETECTED NOT DETECTED  Culture, blood (routine x 2) Call MD if unable to obtain prior to antibiotics being given     Status: None (Preliminary result)   Collection Time: 08/19/15  1:56 PM  Result Value Ref Range   Specimen Description BLOOD LEFT ARM    Special Requests IN PEDIATRIC BOTTLE 1.0 CC    Culture NO GROWTH 1 DAY    Report Status PENDING   Sedimentation rate     Status: Abnormal   Collection Time: 08/19/15  3:55 PM  Result Value Ref Range   Sed Rate 48 (H) 0 - 16 mm/hr  HIV antibody     Status: None   Collection Time: 08/19/15  3:55 PM  Result Value Ref Range   HIV Screen 4th Generation wRfx Non Reactive Non Reactive    Comment: (NOTE) Performed At: [facility]  [place], [place] [id number] Lindon Romp MD IW:[id number]   Antinuclear Antibodies, IFA     Status: None   Collection Time: 08/19/15  3:55 PM  Result Value Ref Range   ANA Ab, IFA Negative     Comment: (NOTE)                                     Negative   <1:80  Borderline  1:80                                     Positive   >1:80 Performed At: [facility] [place], [place] [id number] Lindon Romp MD PH:[id number]   Strep pneumoniae urinary antigen     Status: None   Collection Time: 08/20/15  3:21 AM  Result Value Ref Range   Strep Pneumo Urinary Antigen NEGATIVE NEGATIVE    Comment:        Infection due to S. pneumoniae cannot be absolutely ruled out since the antigen present may be below the detection limit of the test.   CBC with Differential/Platelet     Status: Abnormal   Collection Time: 08/20/15  5:57 AM  Result Value Ref Range   WBC 13.1 (H) 4.0 - 10.5 K/uL   RBC 3.79 (L) 4.22 - 5.81 MIL/uL   Hemoglobin 11.9 (L) 13.0 - 17.0 g/dL   HCT 34.7 (L) 39.0 - 52.0 %   MCV 91.6 78.0 - 100.0 fL   MCH 31.4 26.0 - 34.0 pg   MCHC 34.3 30.0 - 36.0 g/dL   RDW 13.0 11.5 - 15.5 %   Platelets 211 150 - 400 K/uL   Neutrophils Relative % 82 %   Neutro Abs 10.8 (H) 1.7 - 7.7 K/uL   Lymphocytes Relative 10 %   Lymphs Abs 1.3 0.7 - 4.0 K/uL   Monocytes Relative 7 %   Monocytes Absolute 1.0 0.1 - 1.0 K/uL   Eosinophils Relative 1 %   Eosinophils Absolute 0.1 0.0 - 0.7 K/uL   Basophils Relative 0 %   Basophils Absolute 0.0 0.0 - 0.1 K/uL  Comprehensive metabolic panel     Status: Abnormal   Collection Time: 08/20/15  5:57 AM  Result Value Ref Range   Sodium 135 135 - 145 mmol/L   Potassium 3.5 3.5 - 5.1 mmol/L   Chloride 102 101 - 111 mmol/L   CO2 25 22 - 32 mmol/L   Glucose, Bld 123 (H) 65 - 99 mg/dL   BUN 9 6 - 20 mg/dL   Creatinine, Ser 1.11 0.61 - 1.24 mg/dL   Calcium 8.4 (L) 8.9 - 10.3 mg/dL   Total Protein 5.8 (L) 6.5 - 8.1 g/dL   Albumin 2.5 (L) 3.5 - 5.0 g/dL   AST 16 15 - 41 U/L   ALT 21 17 - 63 U/L   Alkaline Phosphatase 76 38 - 126 U/L   Total Bilirubin 0.7 0.3 - 1.2 mg/dL   GFR calc non Af Amer >60 >60 mL/min   GFR calc Af Amer >60 >60 mL/min    Comment: (NOTE) The eGFR has been calculated using the CKD EPI equation. This calculation has not been validated in all clinical  situations. eGFR's persistently <60 mL/min signify possible Chronic Kidney Disease.    Anion gap 8 5 - 15  Magnesium     Status: None   Collection Time: 08/20/15  5:57 AM  Result Value Ref Range   Magnesium 2.0 1.7 - 2.4 mg/dL    MICRO: 6/26 blood cx mrsa IMAGING: Ct Angio Chest Pe W Or Wo Contrast  08/19/2015  CLINICAL DATA:  Pleuritic chest pain. Acute hypoxemic respiratory failure. Cavitary lung lesion. EXAM: CT ANGIOGRAPHY CHEST WITH CONTRAST TECHNIQUE: Multidetector CT imaging of the chest was performed using the standard protocol during bolus administration of intravenous contrast. Multiplanar CT  image reconstructions and MIPs were obtained to evaluate the vascular anatomy. CONTRAST:  100 cc Isovue 370 IV COMPARISON:  CT cervical spine performed 08/18/2015. Chest x-ray 08/17/2015. FINDINGS: Mediastinum/Nodes: Heart is normal size. Aorta is normal caliber. No mediastinal, hilar, or axillary adenopathy. Lungs/Pleura: There are bar peripheral nodular airspace opacities noted within the upper lobes bilaterally, the largest being in the right upper lobe/ apex measuring up to 2.8 cm. Small locules of gas noted centrally suggesting early cavitation. Peripheral left upper lobe nodule with early cavitation noted on image 42 measuring 2 cm. Consolidation noted in the lingula and both lower lobes. Findings are concerning for infectious process, possibly multifocal pneumonia. Septic emboli cannot be excluded. Trace left effusion. Upper abdomen: Imaging into the upper abdomen shows no acute findings. Musculoskeletal: No acute bony abnormality. Chest wall soft tissues are unremarkable. Review of the MIP images confirms the above findings. IMPRESSION: Areas of consolidation in the lingula and both lower lobes concerning for pneumonia. Peripheral nodular opacities in the upper lobes bilaterally, some with early cavitation. These are likely infectious as well. Cannot exclude septic emboli. Electronically Signed    By: Rolm Baptise M.D.   On: 08/19/2015 15:22    HISTORICAL MICRO/IMAGING  Assessment/Plan:  45yo M who presents with pleuretic chest pain found to have cavitary pulmonary lesions and pulm nodules concerning for septic emboli in addition to skin lesions in setting of MRSA bacteremia.   - narrow abtx to vancomycin. Goal to treat for disseminated infection, trough of 15-20 - repeat cx pending - recommend to get TTE to evaluate for endocarditis - i suspect skin lesions on chin,submandibular may evolve to mrsa abscess.         [facility] Staphylococcus aureus bacteremia   Staphylococcus aureus bacteremia (SAB) is associated with a high rate of complications and mortality.  Specific aspects of clinical management are critical to optimizing the outcome of patients with SAB.  Therefore, the [facility] Antimicrobial Management Team [facility]) has initiated an intervention aimed at improving the management of SAB at [facility].  To do so, Infectious Diseases physicians are providing an evidence-based consult for the management of all patients with SAB.     Yes No Comments  Perform follow-up blood cultures (even if the patient is afebrile) to ensure clearance of bacteremia [x] [] 6/27       Perform echocardiography to evaluate for endocarditis (transthoracic ECHO is 40-50% sensitive, TEE is > 90% sensitive) [x] [] Ordered for 6/28       Ensure source control [] [x] Have all abscesses been drained effectively? Have deep seeded infections (septic joints or osteomyelitis) had appropriate surgical debridement?  Investigate for "metastatic" sites of infection [x] [] CT-A showed septic emboli  Change antibiotic therapy to ____vancomycin____ [x] []  If on Vancomycin, goal trough should be 15 - 20 mcg/mL  Estimated duration of IV antibiotic therapy:  4 wk [x] [] Consult case management for probably prolonged outpatient IV antibiotic therapy

## 2015-08-20 NOTE — Progress Notes (Signed)
[facility] Progress Note  Patient: Chase Stuart WSF:[id number]   PCP: No PCP Per Patient DOB: 05/29/1970   DOA: 08/18/2015   DOS: 08/20/2015   Date of Service: the patient was seen and examined on 08/20/2015  Subjective: Continues to have intermittent fever with chills. No nausea no vomiting. Chest pain is improving. No reaction with antibiotics. Nutrition: Tolerating oral diet  Brief hospital course: Pt. admitted on 08/18/2015, with complaint of left-sided chest pain as well as facial swelling, was found to have cavitary pneumonia, cellulitis of the face. Blood cultures positive for MRSA. Infectious diseases will follow up. Currently further plan is further defined etiology including IV antibiotics.  Assessment and Plan: 1. Cellulitis of neck  Cavitary lung lesions MRSA bacteremia.  CT neck shows cervical lymphadenopathy, tonsillar enlargement, inflammation of the skin and soft tissue of the neck as well as cavitary lesions of the lung. Patient's symptom onset was fairly rapid over last 3 days and patient mentions he was relatively healthy 3 days ago. Denies any weight loss denies any night sweats denies any low-grade temperature. Denies any travel outside of [place] or exposure to patient with TB endemic countries. At this point differential for the cavitary lung lesion does not include tubular necrosis. It appears most likely acute bacterial infection probably staphylococcus. Antibiotic will be narrowed to vancomycin. Infectious diseases will follow-up on the patient. Blood cultures, echocardiogram in the morning. Elevated ESR CRP  FOLLOW-UP ANA ANCA. Monitor for improvement. IV hydration will be initiated.  Pain management: When necessary Tylenol, when necessary ibuprofen Activity: Currently dependent no requirement for physical therapy Bowel regimen: last BM 08/19/2015 Diet: Regular diet DVT Prophylaxis: subcutaneous Heparin  Advance goals of care discussion: Full  code  Family Communication: no family was present at bedside, at the time of interview.   Disposition:  Discharge to home. Expected discharge date: 08/23/2015, resolution of bacteremia  Consultants: None Procedures: None  Antibiotics: Anti-infectives    Start     Dose/Rate Route Frequency Ordered Stop   08/18/15 2200  ceFEPIme (MAXIPIME) 1 g in dextrose 5 % 50 mL IVPB  Status:  Discontinued     1 g 100 mL/hr over 30 Minutes Intravenous Every 12 hours 08/18/15 1636 08/18/15 1638   08/18/15 2100  ceFEPIme (MAXIPIME) 1 g in dextrose 5 % 50 mL IVPB  Status:  Discontinued     1 g 100 mL/hr over 30 Minutes Intravenous Every 8 hours 08/18/15 1638 08/20/15 1426   08/18/15 1300  vancomycin (VANCOCIN) IVPB 1000 mg/200 mL premix     1,000 mg 200 mL/hr over 60 Minutes Intravenous Every 12 hours 08/18/15 1234     08/18/15 1215  ceFEPIme (MAXIPIME) 1 g in dextrose 5 % 50 mL IVPB     1 g 100 mL/hr over 30 Minutes Intravenous  Once 08/18/15 1201 08/18/15 1243   08/18/15 1207  ceFEPIme (MAXIPIME) 1 g injection    Comments:  Hodgin, [place]ianna  : cabinet override      08/18/15 1207 08/19/15 0014   08/18/15 1145  clindamycin (CLEOCIN) IVPB 600 mg  Status:  Discontinued     600 mg 100 mL/hr over 30 Minutes Intravenous  Once 08/18/15 1134 08/18/15 1201        Intake/Output Summary (Last 24 hours) at 08/20/15 1639 Last data filed at 08/20/15 0900  Gross per 24 hour  Intake 2914.99 ml  Output    850 ml  Net 2064.99 ml   Filed Weights   08/18/15 1652 08/18/15 2028  08/19/15 2017  Weight: 84 kg (185 lb 3 oz) 84.188 kg (185 lb 9.6 oz) 92 kg (202 lb 13.2 oz)    Objective: Physical Exam: Filed Vitals:   08/20/15 0514 08/20/15 0927 08/20/15 1500 08/20/15 1609  BP: 124/78 118/79  126/79  Pulse: 76 93  90  Temp: 98.3 F (36.8 C) 98.1 F (36.7 C) 99.3 F (37.4 C) 99.6 F (37.6 C)  TempSrc: Oral Oral Oral Oral  Resp: _0 Height:      Weight:      SpO2: 96% 96%  99%    General:  Alert, Awake and Oriented to Time, Place and Person. Appear in mild distress Eyes: PERRL, Conjunctiva normal ENT: Oral Mucosa clear moist. Neck: no JVD, no Abnormal Mass Or lumps Cardiovascular: S1 and S2 Present, no Murmur, Respiratory: Bilateral Air entry equal and Decreased, Clear to Auscultation, no Crackles, no wheezes Abdomen: Bowel Sound present, Soft and no tenderness Skin: no redness, no Rash  Extremities: no Pedal edema, no calf tenderness Neurologic: Grossly no focal neuro deficit. Bilaterally Equal motor strength  Data Reviewed: CBC:  Recent Labs Lab 08/17/15 0310 08/18/15 1015 08/18/15 1648 08/19/15 0803 08/20/15 0557  WBC 11.5* 11.9* 12.8* 12.5* 13.1*  NEUTROABS 9.6* 9.1*  --   --  10.8*  HGB 13.6 14.0 13.5 12.4* 11.9*  HCT 39.1 40.2 39.2 37.2* 34.7*  MCV 91.8 91.6 92.0 90.1 91.6  PLT 206 219 204 216 546   Basic Metabolic Panel:  Recent Labs Lab 08/17/15 0310 08/18/15 1015 08/18/15 1648 08/19/15 0803 08/20/15 0557  NA 131* 132*  --  130* 135  K 4.0 4.1  --  4.0 3.5  CL 98* 96*  --  97* 102  CO2 24 27  --  25 25  GLUCOSE 140* 132*  --  124* 123*  BUN 18 15  --  11 9  CREATININE 1.44* 1.33* 1.25* 1.18 1.11  CALCIUM 8.6* 8.6*  --  8.3* 8.4*  MG  --   --   --   --  2.0    Liver Function Tests:  Recent Labs Lab 08/20/15 0557  AST 16  ALT 21  ALKPHOS 76  BILITOT 0.7  PROT 5.8*  ALBUMIN 2.5*   No results for input(s): LIPASE, AMYLASE in the last 168 hours. No results for input(s): AMMONIA in the last 168 hours. Coagulation Profile: No results for input(s): INR, PROTIME in the last 168 hours. Cardiac Enzymes: No results for input(s): CKTOTAL, CKMB, CKMBINDEX, TROPONINI in the last 168 hours. BNP (last 3 results) No results for input(s): PROBNP in the last 8760 hours.  CBG: No results for input(s): GLUCAP in the last 168 hours.  Studies: No results found.   Scheduled Meds: . enoxaparin (LOVENOX) injection  40 mg Subcutaneous Q24H  .  famotidine  20 mg Oral Daily  . guaiFENesin  600 mg Oral BID  . vancomycin  1,000 mg Intravenous Q12H   Continuous Infusions: . sodium chloride 100 mL/hr at 08/20/15 1622   PRN Meds: acetaminophen, ibuprofen, ondansetron (ZOFRAN) IV  Time spent: 30 minutes  Author: Berle Mull, MD [facility] Pager: [phone] 08/20/2015 4:39 PM  If 7PM-7AM, please contact night-coverage at [email or web address], password [facility]

## 2015-08-21 ENCOUNTER — Inpatient Hospital Stay (HOSPITAL_COMMUNITY): Payer: BLUE CROSS/BLUE SHIELD

## 2015-08-21 DIAGNOSIS — A4102 Sepsis due to Methicillin resistant Staphylococcus aureus: Secondary | ICD-10-CM | POA: Diagnosis present

## 2015-08-21 DIAGNOSIS — R001 Bradycardia, unspecified: Secondary | ICD-10-CM

## 2015-08-21 DIAGNOSIS — L0201 Cutaneous abscess of face: Secondary | ICD-10-CM

## 2015-08-21 DIAGNOSIS — R0781 Pleurodynia: Secondary | ICD-10-CM

## 2015-08-21 LAB — ECHOCARDIOGRAM COMPLETE
E decel time: 187 msec
E/e' ratio: 4.44
FS: 27 % — AB (ref 28–44)
Height: 71 in
IVS/LV PW RATIO, ED: 1.71
LA ID, A-P, ES: 33 mm
LA diam end sys: 33 mm
LA diam index: 1.56 cm/m2
LA vol index: 20.7 mL/m2
LA vol: 43.8 mL
LAVOLA4C: 37.4 mL
LDCA: 4.15 cm2
LV TDI E'LATERAL: 10.8
LV TDI E'MEDIAL: 7.4
LV e' LATERAL: 10.8 cm/s
LVEEAVG: 4.44
LVEEMED: 4.44
LVOTD: 23 mm
MV Dec: 187
MV pk A vel: 59.2 m/s
MV pk E vel: 48 m/s
PW: 8.21 mm — AB (ref 0.6–1.1)
RV TAPSE: 16.5 mm
WEIGHTICAEL: 3245.17 [oz_av]

## 2015-08-21 LAB — CBC
HCT: 32.1 % — ABNORMAL LOW (ref 39.0–52.0)
Hemoglobin: 10.7 g/dL — ABNORMAL LOW (ref 13.0–17.0)
MCH: 29.6 pg (ref 26.0–34.0)
MCHC: 33.3 g/dL (ref 30.0–36.0)
MCV: 88.9 fL (ref 78.0–100.0)
Platelets: 275 10*3/uL (ref 150–400)
RBC: 3.61 MIL/uL — AB (ref 4.22–5.81)
RDW: 13 % (ref 11.5–15.5)
WBC: 11.3 10*3/uL — AB (ref 4.0–10.5)

## 2015-08-21 LAB — BASIC METABOLIC PANEL
Anion gap: 7 (ref 5–15)
BUN: 8 mg/dL (ref 6–20)
CHLORIDE: 102 mmol/L (ref 101–111)
CO2: 24 mmol/L (ref 22–32)
CREATININE: 0.81 mg/dL (ref 0.61–1.24)
Calcium: 7.9 mg/dL — ABNORMAL LOW (ref 8.9–10.3)
Glucose, Bld: 113 mg/dL — ABNORMAL HIGH (ref 65–99)
POTASSIUM: 3.1 mmol/L — AB (ref 3.5–5.1)
SODIUM: 133 mmol/L — AB (ref 135–145)

## 2015-08-21 LAB — RAPID URINE DRUG SCREEN, HOSP PERFORMED
Amphetamines: NOT DETECTED
BENZODIAZEPINES: NOT DETECTED
Barbiturates: NOT DETECTED
Cocaine: NOT DETECTED
Opiates: NOT DETECTED
Tetrahydrocannabinol: NOT DETECTED

## 2015-08-21 LAB — LEGIONELLA PNEUMOPHILA SEROGP 1 UR AG: L. pneumophila Serogp 1 Ur Ag: NEGATIVE

## 2015-08-21 MED ORDER — POTASSIUM CHLORIDE CRYS ER 20 MEQ PO TBCR
40.0000 meq | EXTENDED_RELEASE_TABLET | ORAL | Status: AC
  Administered 2015-08-21 (×2): 40 meq via ORAL
  Filled 2015-08-21 (×2): qty 2

## 2015-08-21 NOTE — Clinical Documentation Improvement (Signed)
Hospitalist Please update your documentation within the medical record to reflect your response to this query. Thank you  Can the diagnosis of "MRSA bacteremia" be further specified?  Possible/ probable/ likely conditions:  Sepsis - specify causative organism if known}  Other  Clinically Undetermined  Document any associated diagnoses/conditions.  Supporting Information: 08/19/15 progr note..."admitted on 08/18/2015, with complaint of left-sided chest pain as well as facial swelling, was found to have cavitary pneumonia, cellulitis of the face."... 08/20/15 ID note.Marland Kitchen.Marland Kitchen."He came to the ED on 6/25 for evaluation. He was found to have fever as high as 101.40F, his exam was remarkable for enlarged submandibular gland, induration to chin, no fluctuance, his labs revealed wbc of 12.8.".Marland Kitchen.."narrow abtx to vancomycin. Goal to treat for disseminated infection, trough of 15-20- repeat cx pending- recommend to get TTE to evaluate for endocarditis..."  Please exercise your independent, professional judgment when responding. A specific answer is not anticipated or expected.  Thank You,  Toribio Harbourphelia R Jaeliana Lococo, RN, BSN, CCDS Certified Clinical Documentation Specialist [place]: Health Information Management [phone]

## 2015-08-21 NOTE — Progress Notes (Signed)
Pt c/o of sharp, shooting R side abdominal pain. He's had this pain for two days. Provided pt with heat packs and notified MD. Will continue to monitor.

## 2015-08-21 NOTE — Consult Note (Signed)
Chase Stuart, Chase Stuart 45 y.o., male [id number]     Chief Complaint: chin swelling  HPI: 45 yo wm, onset chin "zit" last week.  Developed fever, sweats, fatigue.  On 2nd ED visit admitted to hosp 3 days ago.  On IV Vanco.  Culture from chin drainage pending.  Has MRSA positive blood cultures, and apparent septic emboli to lungs with some cavitation.  No prior hx MRSA.  No immune compromise including DM, HIV, Prednisone therapy.  Since being in hospital, progression of chin swelling now with spontaneous drainage.  Some reduction in size of submental mass.  No difficulty breathing or swallowing.  MGN:OIBBCWU reviewed. No pertinent past medical history.  Surg GQ:[id number] reviewed. No pertinent past surgical history.  FHx:  History reviewed. No pertinent family history. SocHx:  reports that he has never smoked. He has never used smokeless tobacco. He reports that he drinks alcohol. He reports that he does not use illicit drugs.  ALLERGIES: No Known Allergies  No prescriptions prior to admission    Results for orders placed or performed during the hospital encounter of 08/18/15 (from the past 48 hour(s))  Strep pneumoniae urinary antigen     Status: None   Collection Time: 08/20/15  3:21 AM  Result Value Ref Range   Strep Pneumo Urinary Antigen NEGATIVE NEGATIVE    Comment:        Infection due to S. pneumoniae cannot be absolutely ruled out since the antigen present may be below the detection limit of the test.   Legionella Pneumophila Serogp 1 Ur Ag     Status: None   Collection Time: 08/20/15  3:21 AM  Result Value Ref Range   L. pneumophila Serogp 1 Ur Ag Negative Negative    Comment: (NOTE) Presumptive negative for L. pneumophila serogroup 1 antigen in urine, suggesting no recent or current infection. Legionnaires' disease cannot be ruled out since other serogroups and species may also cause disease. Performed At: [facility] [place], [place]  [id number] Lindon Romp MD KJ:[id number]    Source of Sample URINE, RANDOM   CBC with Differential/Platelet     Status: Abnormal   Collection Time: 08/20/15  5:57 AM  Result Value Ref Range   WBC 13.1 (H) 4.0 - 10.5 K/uL   RBC 3.79 (L) 4.22 - 5.81 MIL/uL   Hemoglobin 11.9 (L) 13.0 - 17.0 g/dL   HCT 34.7 (L) 39.0 - 52.0 %   MCV 91.6 78.0 - 100.0 fL   MCH 31.4 26.0 - 34.0 pg   MCHC 34.3 30.0 - 36.0 g/dL   RDW 13.0 11.5 - 15.5 %   Platelets 211 150 - 400 K/uL   Neutrophils Relative % 82 %   Neutro Abs 10.8 (H) 1.7 - 7.7 K/uL   Lymphocytes Relative 10 %   Lymphs Abs 1.3 0.7 - 4.0 K/uL   Monocytes Relative 7 %   Monocytes Absolute 1.0 0.1 - 1.0 K/uL   Eosinophils Relative 1 %   Eosinophils Absolute 0.1 0.0 - 0.7 K/uL   Basophils Relative 0 %   Basophils Absolute 0.0 0.0 - 0.1 K/uL  Comprehensive metabolic panel     Status: Abnormal   Collection Time: 08/20/15  5:57 AM  Result Value Ref Range   Sodium 135 135 - 145 mmol/L   Potassium 3.5 3.5 - 5.1 mmol/L   Chloride 102 101 - 111 mmol/L   CO2 25 22 - 32 mmol/L   Glucose, Bld 123 (H) 65 - 99 mg/dL  BUN 9 6 - 20 mg/dL   Creatinine, Ser 1.11 0.61 - 1.24 mg/dL   Calcium 8.4 (L) 8.9 - 10.3 mg/dL   Total Protein 5.8 (L) 6.5 - 8.1 g/dL   Albumin 2.5 (L) 3.5 - 5.0 g/dL   AST 16 15 - 41 U/L   ALT 21 17 - 63 U/L   Alkaline Phosphatase 76 38 - 126 U/L   Total Bilirubin 0.7 0.3 - 1.2 mg/dL   GFR calc non Af Amer >60 >60 mL/min   GFR calc Af Amer >60 >60 mL/min    Comment: (NOTE) The eGFR has been calculated using the CKD EPI equation. This calculation has not been validated in all clinical situations. eGFR's persistently <60 mL/min signify possible Chronic Kidney Disease.    Anion gap 8 5 - 15  Magnesium     Status: None   Collection Time: 08/20/15  5:57 AM  Result Value Ref Range   Magnesium 2.0 1.7 - 2.4 mg/dL  Culture, blood (routine x 2)     Status: None (Preliminary result)   Collection Time: 08/20/15  3:08 PM  Result  Value Ref Range   Specimen Description BLOOD RIGHT ARM    Special Requests BOTTLES DRAWN AEROBIC AND ANAEROBIC 10CC    Culture NO GROWTH < 24 HOURS    Report Status PENDING   Culture, blood (routine x 2)     Status: None (Preliminary result)   Collection Time: 08/20/15  3:13 PM  Result Value Ref Range   Specimen Description BLOOD RIGHT ARM    Special Requests BOTTLES DRAWN AEROBIC AND ANAEROBIC 10CC    Culture NO GROWTH < 24 HOURS    Report Status PENDING   Urine rapid drug screen (hosp performed)     Status: None   Collection Time: 08/21/15  5:31 AM  Result Value Ref Range   Opiates NONE DETECTED NONE DETECTED   Cocaine NONE DETECTED NONE DETECTED   Benzodiazepines NONE DETECTED NONE DETECTED   Amphetamines NONE DETECTED NONE DETECTED   Tetrahydrocannabinol NONE DETECTED NONE DETECTED   Barbiturates NONE DETECTED NONE DETECTED    Comment:        DRUG SCREEN FOR MEDICAL PURPOSES ONLY.  IF CONFIRMATION IS NEEDED FOR ANY PURPOSE, NOTIFY LAB WITHIN 5 DAYS.        LOWEST DETECTABLE LIMITS FOR URINE DRUG SCREEN Drug Class       Cutoff (ng/mL) Amphetamine      1000 Barbiturate      200 Benzodiazepine   654 Tricyclics       650 Opiates          300 Cocaine          300 THC              50   Basic metabolic panel     Status: Abnormal   Collection Time: 08/21/15  5:50 AM  Result Value Ref Range   Sodium 133 (L) 135 - 145 mmol/L   Potassium 3.1 (L) 3.5 - 5.1 mmol/L   Chloride 102 101 - 111 mmol/L   CO2 24 22 - 32 mmol/L   Glucose, Bld 113 (H) 65 - 99 mg/dL   BUN 8 6 - 20 mg/dL   Creatinine, Ser 0.81 0.61 - 1.24 mg/dL   Calcium 7.9 (L) 8.9 - 10.3 mg/dL   GFR calc non Af Amer >60 >60 mL/min   GFR calc Af Amer >60 >60 mL/min    Comment: (NOTE) The eGFR has been calculated using  the CKD EPI equation. This calculation has not been validated in all clinical situations. eGFR's persistently <60 mL/min signify possible Chronic Kidney Disease.    Anion gap 7 5 - 15  CBC      Status: Abnormal   Collection Time: 08/21/15  5:50 AM  Result Value Ref Range   WBC 11.3 (H) 4.0 - 10.5 K/uL   RBC 3.61 (L) 4.22 - 5.81 MIL/uL   Hemoglobin 10.7 (L) 13.0 - 17.0 g/dL   HCT 32.1 (L) 39.0 - 52.0 %   MCV 88.9 78.0 - 100.0 fL   MCH 29.6 26.0 - 34.0 pg   MCHC 33.3 30.0 - 36.0 g/dL   RDW 13.0 11.5 - 15.5 %   Platelets 275 150 - 400 K/uL   No results found.  QSY:HNPMV lower lateral chest wall pain with motion.    Blood pressure 132/82, pulse 99, temperature 98.2 F (36.8 C), temperature source Oral, resp. rate 20, height _0  (1.803 m), weight 92 kg (202 lb 13.2 oz), SpO2 95 %.  PHYSICAL EXAM: limited due to lack of functional headlight.  Overall appearance: overall trim and healthy.  Swollen mentum with purulent drainage from RIGHT side.   Head:  NCAT Ears:  Could not examine Nose:  Could not examine Oral Cavity:  Moist. Teeth in good repair.  No internal swelling of oral vestibule or floor of mouth Oral Pharynx/Hypopharynx/Larynx: could not examine Neuro:  Grossly intact Neck:  Chin swelling, RIGHT>LEFT.  Erythema, tender, some fluctuance.  bilat 2 cm submental nodes, minimally tender and with no overlying skin changes.    Studies Reviewed:  Neck CT scan    Assessment/Plan Mental abscess with submental adenopathy, MRSA, including bacteremia and septic emboli to lungs.  Plan:  I would like to take him to the OR tomorrow and perform Incision and Drainage with packing of the mental abscess(es).  I will explore the submental nodal area but doubt these will need open drainage.  Will obtain additional cultures.  Discussed with patient .  Risks and complications explained.  Questions answered and informed consent obtained.  Jodi Marble 04/21/7373, 6:28 PM

## 2015-08-21 NOTE — Progress Notes (Signed)
    [facility]    Date of Admission:  08/18/2015   Total days of antibiotics 4        Day 4 vanco           ID: Chase Stuart with  MRSA bacteremia, septic emboli to neck and deep tissue infection to chin/neck  Principal Problem:   Cellulitis of neck Active Problems:   Pneumonia   Reactive cervical lymphadenopathy   MRSA bacteremia    Subjective: Right sided pleuretic chest pain but afebrile today.  24hr event: underwent TTE today that did not show any vegetations  Medications:  . enoxaparin (LOVENOX) injection  40 mg Subcutaneous Q24H  . famotidine  20 mg Oral Daily  . guaiFENesin  600 mg Oral BID  . vancomycin  1,000 mg Intravenous Q12H    Objective: Vital signs in last 24 hours: Temp:  [98.1 F (36.7 C)-100 F (37.8 C)] 98.4 F (36.9 C) (06/28 1001) Pulse Rate:  [86-101] 86 (06/28 1001) Resp:  [18-20] 20 (06/28 0404) BP: (114-137)/(77-92) 114/92 mmHg (06/28 1001) SpO2:  [91 %-99 %] 91 % (06/28 1001) Physical Exam  Constitutional: He is oriented to person, place, and time. He appears well-developed and well-nourished. No distress.  HENT:  Mouth/Throat: Oropharynx is clear and moist. No oropharyngeal exudate.  Cardiovascular: Normal rate, regular rhythm and normal heart sounds. Exam reveals no gallop and no friction rub.  No murmur heard.  Pulmonary/Chest: Effort normal and breath sounds normal. No respiratory distress. He has no wheezes.  Abdominal: Soft. Bowel sounds are normal. He exhibits no distension. There is no tenderness.  Lymphadenopathy:  He has no cervical adenopathy.  Neurological: He is alert and oriented to person, place, and time.  Skin: chin lesion has formed 2 white heads superior and inferior aspect. The submental lesion is still firm. Possibly has endurated area to left side of chin Psychiatric: He has a normal mood and affect. His behavior is normal.    Lab Results  Recent Labs   08/20/15 0557 08/21/15 0550  WBC 13.1* 11.3*  HGB 11.9* 10.7*  HCT 34.7* 32.1*  NA 135 133*  K 3.5 3.1*  CL 102 102  CO2 25 24  BUN 9 8  CREATININE 1.11 0.81   Liver Panel  Recent Labs  08/20/15 0557  PROT 5.8*  ALBUMIN 2.5*  AST 16  ALT 21  ALKPHOS 76  BILITOT 0.7   Sedimentation Rate  Recent Labs  08/19/15 1555  ESRSEDRATE 48*   C-Reactive Protein  Recent Labs  08/19/15 1334  CRP 45.7*    Microbiology: 6/27 blood cx ngtd 6/26 blood cx MRSA in 1 of 2 sets Studies/Results: No results found.   Assessment/Plan: Complicated MRSA bacteremia - concern that he still may have endocarditis despite TTE negative. Please get TEE. Repeat cx thus far ngtd at 24hr  Deep tissue abscess to face = i did superficial bedside debridement to large lesion of skin, cleaned area with alcohol. Used needle tip to puncture superficial white head aspect of lesion and expressed thick purulence from furuncle/boil. Please have plastic surgery assess patient if he would benefit from formal debridement of chin, and more specifically the submandibular/submental lesion  pleuretic chest pain = likely referred pain from septic emboli  [name], [facility] Cell: [phone] Pager: [phone]  08/21/2015, 3:44 PM

## 2015-08-21 NOTE — Progress Notes (Signed)
  Echocardiogram 2D Echocardiogram has been performed.  Leta JunglingCooper, Keora Eccleston M 08/21/2015, 12:06 PM

## 2015-08-21 NOTE — Progress Notes (Addendum)
[facility] Progress Note  Patient: Chase Stuart WGY:[id number]   PCP: No PCP Per Patient DOB: [birth year]   DOA: 08/18/2015   DOS: 08/21/2015   Date of Service: the patient was seen and examined on 08/21/2015  Subjective: Fever and chills is getting better. Complaining of right-sided chest pain which is pleuritic in nature. Redness of the neck is resolved. Nutrition: Tolerating oral diet  Brief hospital course: Pt. admitted on 08/18/2015, with complaint of left-sided chest pain as well as facial swelling, was found to have cavitary pneumonia, cellulitis of the face. Blood cultures positive for MRSA. Infectious diseases will follow up. Currently further plan is further defined etiology including IV antibiotics.  Assessment and Plan: 1. Sepsis due to methicillin resistant Staphylococcus aureus (MRSA) ([place])  Cellulitis of neck, Cavitary lung lesions MRSA bacteremia.  CT neck shows cervical lymphadenopathy, tonsillar enlargement, inflammation of the skin and soft tissue of the neck as well as cavitary lesions of the lung. With tachycardia, fever, leukocytosis patient meeting sepsis criteria on admission.  Blood cultures on admission positive for MRSA, repeat blood cultures negative for 24 hours. Elevated ESR CRP, normal ANA ANCA. Antibiotic will be narrowed to vancomycin. Infectious diseases inserted, appreciate input. Echocardiogram does not show any evidence of vegetation. IV hydration stopped. TEE scheduled on Friday, 08/23/2015. Consulted ENT for neck abscess, evaluation scheduled on 08/22/2015. We will keep nothing by mouth after midnight.  Patient's symptom onset was fairly rapid over last 3 days and patient mentions he was relatively healthy 3 days ago. Denies any weight loss denies any night sweats denies any low-grade temperature. Denies any travel outside of [place] or exposure to patient with TB endemic countries. At this point differential for the cavitary lung  lesion does not include tuberculosis.  Pain management: When necessary Tylenol, when necessary ibuprofen Activity: Currently dependent no requirement for physical therapy Bowel regimen: last BM 08/19/2015 Diet: Regular diet DVT Prophylaxis: subcutaneous Heparin  Advance goals of care discussion: Full code  Family Communication: no family was present at bedside, at the time of interview.   Disposition:  Discharge to home. Expected discharge date: 08/24/2015, resolution of bacteremia  Consultants: Infectious disease, cardiology for TEE, ENT for abscess Procedures: Echocardiogram  Antibiotics: Anti-infectives    Start     Dose/Rate Route Frequency Ordered Stop   08/18/15 2200  ceFEPIme (MAXIPIME) 1 g in dextrose 5 % 50 mL IVPB  Status:  Discontinued     1 g 100 mL/hr over 30 Minutes Intravenous Every 12 hours 08/18/15 1636 08/18/15 1638   08/18/15 2100  ceFEPIme (MAXIPIME) 1 g in dextrose 5 % 50 mL IVPB  Status:  Discontinued     1 g 100 mL/hr over 30 Minutes Intravenous Every 8 hours 08/18/15 1638 08/20/15 1426   08/18/15 1300  vancomycin (VANCOCIN) IVPB 1000 mg/200 mL premix     1,000 mg 200 mL/hr over 60 Minutes Intravenous Every 12 hours 08/18/15 1234     08/18/15 1215  ceFEPIme (MAXIPIME) 1 g in dextrose 5 % 50 mL IVPB     1 g 100 mL/hr over 30 Minutes Intravenous  Once 08/18/15 1201 08/18/15 1243   08/18/15 1207  ceFEPIme (MAXIPIME) 1 g injection    Comments:  [place], [place]  : cabinet override      08/18/15 1207 08/19/15 0014   08/18/15 1145  clindamycin (CLEOCIN) IVPB 600 mg  Status:  Discontinued     600 mg 100 mL/hr over 30 Minutes Intravenous  Once 08/18/15 1134 08/18/15 1201  Intake/Output Summary (Last 24 hours) at 08/21/15 1807 Last data filed at 08/21/15 1728  Gross per 24 hour  Intake 2371.66 ml  Output    600 ml  Net 1771.66 ml   Filed Weights   08/18/15 1652 08/18/15 2028 08/19/15 2017  Weight: 84 kg (185 lb 3 oz) 84.188 kg (185 lb 9.6 oz)  92 kg (202 lb 13.2 oz)    Objective: Physical Exam: Filed Vitals:   08/20/15 2020 08/21/15 0404 08/21/15 1001 08/21/15 1726  BP: 124/79 137/77 114/92 132/82  Pulse: 101 98 86 99  Temp: 98.1 F (36.7 C) 100 F (37.8 C) 98.4 F (36.9 C) 98.2 F (36.8 C)  TempSrc: Oral Oral Oral Oral  Resp: _0 Height:      Weight:      SpO2: 93% 94% 91% 95%    General: Alert, Awake and Oriented to Time, Place and Person. Appear in mild distress Eyes: PERRL, Conjunctiva normal ENT: Oral Mucosa clear moist. Neck: no JVD, no Abnormal Mass Or lumps Cardiovascular: S1 and S2 Present, no Murmur, Respiratory: Bilateral Air entry equal and Decreased, Clear to Auscultation, no Crackles, no wheezes Abdomen: Bowel Sound present, Soft and no tenderness Skin: no redness, no Rash  Extremities: no Pedal edema, no calf tenderness Neurologic: Grossly no focal neuro deficit. Bilaterally Equal motor strength  Data Reviewed: CBC:  Recent Labs Lab 08/17/15 0310 08/18/15 1015 08/18/15 1648 08/19/15 0803 08/20/15 0557 08/21/15 0550  WBC 11.5* 11.9* 12.8* 12.5* 13.1* 11.3*  NEUTROABS 9.6* 9.1*  --   --  10.8*  --   HGB 13.6 14.0 13.5 12.4* 11.9* 10.7*  HCT 39.1 40.2 39.2 37.2* 34.7* 32.1*  MCV 91.8 91.6 92.0 90.1 91.6 88.9  PLT 206 219 204 216 211 161   Basic Metabolic Panel:  Recent Labs Lab 08/17/15 0310 08/18/15 1015 08/18/15 1648 08/19/15 0803 08/20/15 0557 08/21/15 0550  NA 131* 132*  --  130* 135 133*  K 4.0 4.1  --  4.0 3.5 3.1*  CL 98* 96*  --  97* 102 102  CO2 24 27  --  _1 GLUCOSE 140* 132*  --  124* 123* 113*  BUN 18 15  --  _2 CREATININE 1.44* 1.33* 1.25* 1.18 1.11 0.81  CALCIUM 8.6* 8.6*  --  8.3* 8.4* 7.9*  MG  --   --   --   --  2.0  --     Liver Function Tests:  Recent Labs Lab 08/20/15 0557  AST 16  ALT 21  ALKPHOS 76  BILITOT 0.7  PROT 5.8*  ALBUMIN 2.5*   No results for input(s): LIPASE, AMYLASE in the last 168 hours. No results for  input(s): AMMONIA in the last 168 hours. Coagulation Profile: No results for input(s): INR, PROTIME in the last 168 hours. Cardiac Enzymes: No results for input(s): CKTOTAL, CKMB, CKMBINDEX, TROPONINI in the last 168 hours. BNP (last 3 results) No results for input(s): PROBNP in the last 8760 hours.  CBG: No results for input(s): GLUCAP in the last 168 hours.  Studies: No results found.   Scheduled Meds: . enoxaparin (LOVENOX) injection  40 mg Subcutaneous Q24H  . famotidine  20 mg Oral Daily  . guaiFENesin  600 mg Oral BID  . vancomycin  1,000 mg Intravenous Q12H   Continuous Infusions:   PRN Meds: acetaminophen, ibuprofen, ondansetron (ZOFRAN) IV  Time spent: 30 minutes  Author: Berle Mull, MD [facility] Pager: [phone] 08/21/2015  6:07 PM  If 7PM-7AM, please contact night-coverage at [email or web address], password [facility]

## 2015-08-22 ENCOUNTER — Encounter (HOSPITAL_COMMUNITY): Payer: Self-pay | Admitting: Certified Registered Nurse Anesthetist

## 2015-08-22 ENCOUNTER — Inpatient Hospital Stay (HOSPITAL_COMMUNITY): Payer: BLUE CROSS/BLUE SHIELD | Admitting: Certified Registered Nurse Anesthetist

## 2015-08-22 ENCOUNTER — Encounter (HOSPITAL_COMMUNITY)
Admission: EM | Disposition: A | Discharge status: Home Health | Payer: Self-pay | Source: Home / Self Care | Attending: Internal Medicine

## 2015-08-22 DIAGNOSIS — Z5181 Encounter for therapeutic drug level monitoring: Secondary | ICD-10-CM

## 2015-08-22 DIAGNOSIS — L0201 Cutaneous abscess of face: Secondary | ICD-10-CM

## 2015-08-22 HISTORY — PX: INCISION AND DRAINAGE ABSCESS: SHX5864

## 2015-08-22 LAB — CBC
HEMATOCRIT: 35.4 % — AB (ref 39.0–52.0)
Hemoglobin: 11.6 g/dL — ABNORMAL LOW (ref 13.0–17.0)
MCH: 30.1 pg (ref 26.0–34.0)
MCHC: 32.8 g/dL (ref 30.0–36.0)
MCV: 91.9 fL (ref 78.0–100.0)
Platelets: 394 10*3/uL (ref 150–400)
RBC: 3.85 MIL/uL — ABNORMAL LOW (ref 4.22–5.81)
RDW: 13.2 % (ref 11.5–15.5)
WBC: 10.5 10*3/uL (ref 4.0–10.5)

## 2015-08-22 LAB — CULTURE, BLOOD (ROUTINE X 2)

## 2015-08-22 LAB — BASIC METABOLIC PANEL
Anion gap: 8 (ref 5–15)
BUN: 8 mg/dL (ref 6–20)
CHLORIDE: 103 mmol/L (ref 101–111)
CO2: 27 mmol/L (ref 22–32)
CREATININE: 0.85 mg/dL (ref 0.61–1.24)
Calcium: 8.7 mg/dL — ABNORMAL LOW (ref 8.9–10.3)
GFR calc Af Amer: >60 mL/min (ref >60)
GFR calc non Af Amer: >60 mL/min (ref >60)
Glucose, Bld: 95 mg/dL (ref 65–99)
Potassium: 3.7 mmol/L (ref 3.5–5.1)
SODIUM: 138 mmol/L (ref 135–145)

## 2015-08-22 LAB — SURGICAL PCR SCREEN
MRSA, PCR: POSITIVE — AB
Staphylococcus aureus: POSITIVE — AB

## 2015-08-22 LAB — VANCOMYCIN, TROUGH: Vancomycin Tr: 6 ug/mL — ABNORMAL LOW (ref 15–20)

## 2015-08-22 LAB — MAGNESIUM: MAGNESIUM: 2 mg/dL (ref 1.7–2.4)

## 2015-08-22 SURGERY — INCISION AND DRAINAGE, ABSCESS
Anesthesia: General | Site: Neck

## 2015-08-22 MED ORDER — FENTANYL CITRATE (PF) 100 MCG/2ML IJ SOLN: 25.0000 ug | INTRAMUSCULAR | Status: DC | PRN

## 2015-08-22 MED ORDER — FENTANYL CITRATE (PF) 250 MCG/5ML IJ SOLN
INTRAMUSCULAR | Status: AC
  Filled 2015-08-22: qty 5

## 2015-08-22 MED ORDER — DEXTROSE-NACL 5-0.45 % IV SOLN
INTRAVENOUS | Status: DC
  Administered 2015-08-22: 13:00:00 via INTRAVENOUS

## 2015-08-22 MED ORDER — 0.9 % SODIUM CHLORIDE (POUR BTL) OPTIME
TOPICAL | Status: DC | PRN
  Administered 2015-08-22: 1000 mL

## 2015-08-22 MED ORDER — MUPIROCIN 2 % EX OINT
1.0000 "application " | TOPICAL_OINTMENT | Freq: Two times a day (BID) | CUTANEOUS | Status: DC
  Administered 2015-08-22 – 2015-08-24 (×4): 1 via NASAL
  Filled 2015-08-22 (×2): qty 22

## 2015-08-22 MED ORDER — CHLORHEXIDINE GLUCONATE CLOTH 2 % EX PADS
6.0000 | MEDICATED_PAD | Freq: Once | CUTANEOUS | Status: DC
Start: 2015-08-22 — End: 2015-08-22

## 2015-08-22 MED ORDER — PROMETHAZINE HCL 25 MG/ML IJ SOLN: 6.2500 mg | INTRAMUSCULAR | Status: DC | PRN

## 2015-08-22 MED ORDER — FENTANYL CITRATE (PF) 100 MCG/2ML IJ SOLN
INTRAMUSCULAR | Status: DC | PRN
  Administered 2015-08-22 (×3): 50 ug via INTRAVENOUS
  Administered 2015-08-22: 100 ug via INTRAVENOUS

## 2015-08-22 MED ORDER — ACETAMINOPHEN 325 MG PO TABS
ORAL_TABLET | ORAL | Status: AC
  Filled 2015-08-22: qty 2

## 2015-08-22 MED ORDER — BACITRACIN ZINC 500 UNIT/GM EX OINT
TOPICAL_OINTMENT | CUTANEOUS | Status: AC
  Filled 2015-08-22: qty 28.35

## 2015-08-22 MED ORDER — MIDAZOLAM HCL 2 MG/2ML IJ SOLN
INTRAMUSCULAR | Status: AC
  Filled 2015-08-22: qty 2

## 2015-08-22 MED ORDER — CHLORHEXIDINE GLUCONATE CLOTH 2 % EX PADS
6.0000 | MEDICATED_PAD | Freq: Every day | CUTANEOUS | Status: DC
  Administered 2015-08-22 – 2015-08-24 (×3): 6 via TOPICAL

## 2015-08-22 MED ORDER — ONDANSETRON HCL 4 MG/2ML IJ SOLN
INTRAMUSCULAR | Status: DC | PRN
  Administered 2015-08-22: 4 mg via INTRAVENOUS

## 2015-08-22 MED ORDER — PROPOFOL 10 MG/ML IV BOLUS
INTRAVENOUS | Status: DC | PRN
  Administered 2015-08-22: 200 mg via INTRAVENOUS

## 2015-08-22 MED ORDER — VANCOMYCIN HCL IN DEXTROSE 1-5 GM/200ML-% IV SOLN
1000.0000 mg | Freq: Three times a day (TID) | INTRAVENOUS | Status: DC
  Administered 2015-08-22 – 2015-08-24 (×7): 1000 mg via INTRAVENOUS
  Filled 2015-08-22 (×8): qty 200

## 2015-08-22 MED ORDER — LIDOCAINE 2% (20 MG/ML) 5 ML SYRINGE
INTRAMUSCULAR | Status: AC
  Filled 2015-08-22: qty 5

## 2015-08-22 MED ORDER — PROPOFOL 10 MG/ML IV BOLUS
INTRAVENOUS | Status: AC
  Filled 2015-08-22: qty 20

## 2015-08-22 MED ORDER — CHLORHEXIDINE GLUCONATE CLOTH 2 % EX PADS: 6.0000 | MEDICATED_PAD | Freq: Once | CUTANEOUS | Status: DC

## 2015-08-22 MED ORDER — ONDANSETRON HCL 4 MG/2ML IJ SOLN
INTRAMUSCULAR | Status: AC
  Filled 2015-08-22: qty 2

## 2015-08-22 MED ORDER — MEPERIDINE HCL 25 MG/ML IJ SOLN: 6.2500 mg | INTRAMUSCULAR | Status: DC | PRN

## 2015-08-22 MED ORDER — LIDOCAINE 2% (20 MG/ML) 5 ML SYRINGE
INTRAMUSCULAR | Status: DC | PRN
  Administered 2015-08-22: 100 mg via INTRAVENOUS

## 2015-08-22 MED ORDER — LACTATED RINGERS IV SOLN
INTRAVENOUS | Status: DC
  Administered 2015-08-22 (×2): via INTRAVENOUS

## 2015-08-22 SURGICAL SUPPLY — 53 items
APPLIER CLIP 9.375 SM OPEN (CLIP) ×3
ATTRACTOMAT 16X20 MAGNETIC DRP (DRAPES) ×3 IMPLANT
BLADE SURG 15 STRL LF DISP TIS (BLADE) IMPLANT
BLADE SURG 15 STRL SS (BLADE)
BNDG GAUZE ELAST 4 BULKY (GAUZE/BANDAGES/DRESSINGS) ×3 IMPLANT
CANISTER SUCTION 2500CC (MISCELLANEOUS) ×3 IMPLANT
CATH ROBINSON RED A/P 14FR (CATHETERS) ×3 IMPLANT
CLEANER TIP ELECTROSURG 2X2 (MISCELLANEOUS) ×3 IMPLANT
CLIP APPLIE 9.375 SM OPEN (CLIP) ×1 IMPLANT
CONT SPEC 4OZ CLIKSEAL STRL BL (MISCELLANEOUS) ×3 IMPLANT
CORDS BIPOLAR (ELECTRODE) IMPLANT
COVER SURGICAL LIGHT HANDLE (MISCELLANEOUS) ×3 IMPLANT
CRADLE DONUT ADULT HEAD (MISCELLANEOUS) IMPLANT
DRAIN CHANNEL 10F 3/8 F FF (DRAIN) ×3 IMPLANT
DRAIN CHANNEL 15F RND FF W/TCR (WOUND CARE) IMPLANT
DRAIN PENROSE 1/2X12 LTX STRL (WOUND CARE) IMPLANT
DRAPE PROXIMA HALF (DRAPES) ×3 IMPLANT
ELECT COATED BLADE 2.86 ST (ELECTRODE) ×3 IMPLANT
ELECT REM PT RETURN 9FT ADLT (ELECTROSURGICAL) ×3
ELECTRODE REM PT RTRN 9FT ADLT (ELECTROSURGICAL) ×1 IMPLANT
EVACUATOR SILICONE 100CC (DRAIN) ×3 IMPLANT
GAUZE IODOFORM PACK 1/2 7832 (GAUZE/BANDAGES/DRESSINGS) ×3 IMPLANT
GAUZE SPONGE 4X4 16PLY XRAY LF (GAUZE/BANDAGES/DRESSINGS) ×6 IMPLANT
GLOVE ECLIPSE 8.0 STRL XLNG CF (GLOVE) ×3 IMPLANT
GOWN STRL REUS W/ TWL LRG LVL3 (GOWN DISPOSABLE) ×1 IMPLANT
GOWN STRL REUS W/ TWL XL LVL3 (GOWN DISPOSABLE) ×1 IMPLANT
GOWN STRL REUS W/TWL LRG LVL3 (GOWN DISPOSABLE) ×2
GOWN STRL REUS W/TWL XL LVL3 (GOWN DISPOSABLE) ×2
KIT BASIN OR (CUSTOM PROCEDURE TRAY) ×3 IMPLANT
KIT ROOM TURNOVER OR (KITS) ×3 IMPLANT
LOCATOR NERVE 3 VOLT (DISPOSABLE) IMPLANT
NEEDLE HYPO 25GX1X1/2 BEV (NEEDLE) ×6 IMPLANT
NS IRRIG 1000ML POUR BTL (IV SOLUTION) ×3 IMPLANT
PAD ARMBOARD 7.5X6 YLW CONV (MISCELLANEOUS) ×6 IMPLANT
PENCIL BUTTON HOLSTER BLD 10FT (ELECTRODE) ×3 IMPLANT
SPECIMEN JAR MEDIUM (MISCELLANEOUS) ×3 IMPLANT
SPONGE INTESTINAL PEANUT (DISPOSABLE) IMPLANT
SPONGE LAP 18X18 X RAY DECT (DISPOSABLE) ×3 IMPLANT
STAPLER VISISTAT 35W (STAPLE) ×3 IMPLANT
SUT CHROMIC 4 0 PS 2 18 (SUTURE) ×6 IMPLANT
SUT CHROMIC 5 0 P 3 (SUTURE) ×6 IMPLANT
SUT ETHILON 3 0 PS 1 (SUTURE) ×3 IMPLANT
SUT ETHILON 5 0 PS 2 18 (SUTURE) ×3 IMPLANT
SUT SILK 2 0 REEL (SUTURE) ×3 IMPLANT
SUT SILK 2 0 SH (SUTURE) ×3 IMPLANT
SUT SILK 2 0 SH CR/8 (SUTURE) IMPLANT
SUT SILK 3 0 REEL (SUTURE) IMPLANT
SUT SILK 4 0 REEL (SUTURE) IMPLANT
SYRINGE TOOMEY DISP (SYRINGE) ×3 IMPLANT
TOWEL OR 17X24 6PK STRL BLUE (TOWEL DISPOSABLE) ×3 IMPLANT
TRAY ENT MC OR (CUSTOM PROCEDURE TRAY) ×3 IMPLANT
TRAY FOLEY CATH 14FRSI W/METER (CATHETERS) IMPLANT
WATER STERILE IRR 1000ML POUR (IV SOLUTION) IMPLANT

## 2015-08-22 NOTE — H&P (View-Only) (Signed)
Chase Stuart, Chase Stuart 45 y.o., male [id number]     Chief Complaint: chin swelling  HPI: 45 yo wm, onset chin "zit" last week.  Developed fever, sweats, fatigue.  On 2nd ED visit admitted to hosp 3 days ago.  On IV Vanco.  Culture from chin drainage pending.  Has MRSA positive blood cultures, and apparent septic emboli to lungs with some cavitation.  No prior hx MRSA.  No immune compromise including DM, HIV, Prednisone therapy.  Since being in hospital, progression of chin swelling now with spontaneous drainage.  Some reduction in size of submental mass.  No difficulty breathing or swallowing.  MGN:OIBBCWU reviewed. No pertinent past medical history.  Surg GQ:[id number] reviewed. No pertinent past surgical history.  FHx:  History reviewed. No pertinent family history. SocHx:  reports that he has never smoked. He has never used smokeless tobacco. He reports that he drinks alcohol. He reports that he does not use illicit drugs.  ALLERGIES: No Known Allergies  No prescriptions prior to admission    Results for orders placed or performed during the hospital encounter of 08/18/15 (from the past 48 hour(s))  Strep pneumoniae urinary antigen     Status: None   Collection Time: 08/20/15  3:21 AM  Result Value Ref Range   Strep Pneumo Urinary Antigen NEGATIVE NEGATIVE    Comment:        Infection due to S. pneumoniae cannot be absolutely ruled out since the antigen present may be below the detection limit of the test.   Legionella Pneumophila Serogp 1 Ur Ag     Status: None   Collection Time: 08/20/15  3:21 AM  Result Value Ref Range   L. pneumophila Serogp 1 Ur Ag Negative Negative    Comment: (NOTE) Presumptive negative for L. pneumophila serogroup 1 antigen in urine, suggesting no recent or current infection. Legionnaires' disease cannot be ruled out since other serogroups and species may also cause disease. Performed At: [facility] [place], [place]  [id number] Lindon Romp MD KJ:[id number]    Source of Sample URINE, RANDOM   CBC with Differential/Platelet     Status: Abnormal   Collection Time: 08/20/15  5:57 AM  Result Value Ref Range   WBC 13.1 (H) 4.0 - 10.5 K/uL   RBC 3.79 (L) 4.22 - 5.81 MIL/uL   Hemoglobin 11.9 (L) 13.0 - 17.0 g/dL   HCT 34.7 (L) 39.0 - 52.0 %   MCV 91.6 78.0 - 100.0 fL   MCH 31.4 26.0 - 34.0 pg   MCHC 34.3 30.0 - 36.0 g/dL   RDW 13.0 11.5 - 15.5 %   Platelets 211 150 - 400 K/uL   Neutrophils Relative % 82 %   Neutro Abs 10.8 (H) 1.7 - 7.7 K/uL   Lymphocytes Relative 10 %   Lymphs Abs 1.3 0.7 - 4.0 K/uL   Monocytes Relative 7 %   Monocytes Absolute 1.0 0.1 - 1.0 K/uL   Eosinophils Relative 1 %   Eosinophils Absolute 0.1 0.0 - 0.7 K/uL   Basophils Relative 0 %   Basophils Absolute 0.0 0.0 - 0.1 K/uL  Comprehensive metabolic panel     Status: Abnormal   Collection Time: 08/20/15  5:57 AM  Result Value Ref Range   Sodium 135 135 - 145 mmol/L   Potassium 3.5 3.5 - 5.1 mmol/L   Chloride 102 101 - 111 mmol/L   CO2 25 22 - 32 mmol/L   Glucose, Bld 123 (H) 65 - 99 mg/dL  BUN 9 6 - 20 mg/dL   Creatinine, Ser 1.11 0.61 - 1.24 mg/dL   Calcium 8.4 (L) 8.9 - 10.3 mg/dL   Total Protein 5.8 (L) 6.5 - 8.1 g/dL   Albumin 2.5 (L) 3.5 - 5.0 g/dL   AST 16 15 - 41 U/L   ALT 21 17 - 63 U/L   Alkaline Phosphatase 76 38 - 126 U/L   Total Bilirubin 0.7 0.3 - 1.2 mg/dL   GFR calc non Af Amer >60 >60 mL/min   GFR calc Af Amer >60 >60 mL/min    Comment: (NOTE) The eGFR has been calculated using the CKD EPI equation. This calculation has not been validated in all clinical situations. eGFR's persistently <60 mL/min signify possible Chronic Kidney Disease.    Anion gap 8 5 - 15  Magnesium     Status: None   Collection Time: 08/20/15  5:57 AM  Result Value Ref Range   Magnesium 2.0 1.7 - 2.4 mg/dL  Culture, blood (routine x 2)     Status: None (Preliminary result)   Collection Time: 08/20/15  3:08 PM  Result  Value Ref Range   Specimen Description BLOOD RIGHT ARM    Special Requests BOTTLES DRAWN AEROBIC AND ANAEROBIC 10CC    Culture NO GROWTH < 24 HOURS    Report Status PENDING   Culture, blood (routine x 2)     Status: None (Preliminary result)   Collection Time: 08/20/15  3:13 PM  Result Value Ref Range   Specimen Description BLOOD RIGHT ARM    Special Requests BOTTLES DRAWN AEROBIC AND ANAEROBIC 10CC    Culture NO GROWTH < 24 HOURS    Report Status PENDING   Urine rapid drug screen (hosp performed)     Status: None   Collection Time: 08/21/15  5:31 AM  Result Value Ref Range   Opiates NONE DETECTED NONE DETECTED   Cocaine NONE DETECTED NONE DETECTED   Benzodiazepines NONE DETECTED NONE DETECTED   Amphetamines NONE DETECTED NONE DETECTED   Tetrahydrocannabinol NONE DETECTED NONE DETECTED   Barbiturates NONE DETECTED NONE DETECTED    Comment:        DRUG SCREEN FOR MEDICAL PURPOSES ONLY.  IF CONFIRMATION IS NEEDED FOR ANY PURPOSE, NOTIFY LAB WITHIN 5 DAYS.        LOWEST DETECTABLE LIMITS FOR URINE DRUG SCREEN Drug Class       Cutoff (ng/mL) Amphetamine      1000 Barbiturate      200 Benzodiazepine   654 Tricyclics       650 Opiates          300 Cocaine          300 THC              50   Basic metabolic panel     Status: Abnormal   Collection Time: 08/21/15  5:50 AM  Result Value Ref Range   Sodium 133 (L) 135 - 145 mmol/L   Potassium 3.1 (L) 3.5 - 5.1 mmol/L   Chloride 102 101 - 111 mmol/L   CO2 24 22 - 32 mmol/L   Glucose, Bld 113 (H) 65 - 99 mg/dL   BUN 8 6 - 20 mg/dL   Creatinine, Ser 0.81 0.61 - 1.24 mg/dL   Calcium 7.9 (L) 8.9 - 10.3 mg/dL   GFR calc non Af Amer >60 >60 mL/min   GFR calc Af Amer >60 >60 mL/min    Comment: (NOTE) The eGFR has been calculated using  the CKD EPI equation. This calculation has not been validated in all clinical situations. eGFR's persistently <60 mL/min signify possible Chronic Kidney Disease.    Anion gap 7 5 - 15  CBC      Status: Abnormal   Collection Time: 08/21/15  5:50 AM  Result Value Ref Range   WBC 11.3 (H) 4.0 - 10.5 K/uL   RBC 3.61 (L) 4.22 - 5.81 MIL/uL   Hemoglobin 10.7 (L) 13.0 - 17.0 g/dL   HCT 32.1 (L) 39.0 - 52.0 %   MCV 88.9 78.0 - 100.0 fL   MCH 29.6 26.0 - 34.0 pg   MCHC 33.3 30.0 - 36.0 g/dL   RDW 13.0 11.5 - 15.5 %   Platelets 275 150 - 400 K/uL   No results found.  QSY:HNPMV lower lateral chest wall pain with motion.    Blood pressure 132/82, pulse 99, temperature 98.2 F (36.8 C), temperature source Oral, resp. rate 20, height _0  (1.803 m), weight 92 kg (202 lb 13.2 oz), SpO2 95 %.  PHYSICAL EXAM: limited due to lack of functional headlight.  Overall appearance: overall trim and healthy.  Swollen mentum with purulent drainage from RIGHT side.   Head:  NCAT Ears:  Could not examine Nose:  Could not examine Oral Cavity:  Moist. Teeth in good repair.  No internal swelling of oral vestibule or floor of mouth Oral Pharynx/Hypopharynx/Larynx: could not examine Neuro:  Grossly intact Neck:  Chin swelling, RIGHT>LEFT.  Erythema, tender, some fluctuance.  bilat 2 cm submental nodes, minimally tender and with no overlying skin changes.    Studies Reviewed:  Neck CT scan    Assessment/Plan Mental abscess with submental adenopathy, MRSA, including bacteremia and septic emboli to lungs.  Plan:  I would like to take him to the OR tomorrow and perform Incision and Drainage with packing of the mental abscess(es).  I will explore the submental nodal area but doubt these will need open drainage.  Will obtain additional cultures.  Discussed with patient .  Risks and complications explained.  Questions answered and informed consent obtained.  Jodi Marble 04/21/7373, 6:28 PM

## 2015-08-22 NOTE — Progress Notes (Signed)
[facility]    Date of Admission:  08/18/2015   Total days of antibiotics 4        Day 4 vanco           ID: Chase Stuart is a 45 y.o. male with  MRSA bacteremia, septic emboli to neck and deep tissue infection to chin/neck  Principal Problem:   Sepsis due to methicillin resistant Staphylococcus aureus (MRSA) (HCC) Active Problems:   Pneumonia   Cellulitis of neck   Reactive cervical lymphadenopathy   MRSA bacteremia   Facial abscess    Subjective: Afebrile,  24hr event: underwent irrigation,debridement, exploration of carbuncle/furuncles of mental/submental region. subtherapeutic on vanco  Medications:  . Chlorhexidine Gluconate Cloth  6 each Topical Q0600  . enoxaparin (LOVENOX) injection  40 mg Subcutaneous Q24H  . famotidine  20 mg Oral Daily  . guaiFENesin  600 mg Oral BID  . mupirocin ointment  1 application Nasal BID  . vancomycin  1,000 mg Intravenous Q8H    Objective: Vital signs in last 24 hours: Temp:  [97.8 F (36.6 C)-98.5 F (36.9 C)] 98.1 F (36.7 C) (06/29 1256) Pulse Rate:  [77-99] 89 (06/29 1256) Resp:  [18-30] 18 (06/29 1256) BP: (107-154)/(79-98) 129/98 mmHg (06/29 1256) SpO2:  [87 %-96 %] 96 % (06/29 1256) Weight:  [201 lb 8 oz (91.4 kg)] 201 lb 8 oz (91.4 kg) (06/28 2237) Physical Exam  Constitutional: He is oriented to person, place, and time. He appears well-developed and well-nourished. No distress.  HENT:  Mouth/Throat: Oropharynx is clear and moist. No oropharyngeal exudate.  Cardiovascular: Normal rate, regular rhythm and normal heart sounds. Exam reveals no gallop and no friction rub.  No murmur heard.  Pulmonary/Chest: Effort normal and breath sounds normal. No respiratory distress. He has no wheezes.  Abdominal: Soft. Bowel sounds are normal. He exhibits no distension. There is no tenderness.  Lymphadenopathy:  He has no cervical adenopathy.  Neurological: He is alert and oriented to person, place,  and time.  Skin: area of neck is wrapped Psychiatric: He has a normal mood and affect. His behavior is normal.    Lab Results  Recent Labs  08/21/15 0550 08/22/15 0526  WBC 11.3* 10.5  HGB 10.7* 11.6*  HCT 32.1* 35.4*  NA 133* 138  K 3.1* 3.7  CL 102 103  CO2 24 27  BUN 8 8  CREATININE 0.81 0.85   Liver Panel  Recent Labs  08/20/15 0557  PROT 5.8*  ALBUMIN 2.5*  AST 16  ALT 21  ALKPHOS 76  BILITOT 0.7   Sedimentation Rate  Recent Labs  08/19/15 1555  ESRSEDRATE 48*   C-Reactive Protein No results for input(s): CRP in the last 72 hours.  Microbiology: 6/27 blood cx ngtd 6/26 blood cx MRSA in 1 of 2 sets 6/29 wound cx GPCC on gram stain Studies/Results: No results found.   Assessment/Plan: Complicated MRSA bacteremia - concern that he still may have endocarditis despite TTE negative. Please get TEE. Repeat cx thus far ngtd at 24hr  Deep tissue abscess to face = appreciate dr. wolicki for doing debridement of submandibular/submental lesion  pleuretic chest pain = likely referred pain from septic emboli  TDM = vancomycin level is low and vancomycin adjusted to 1gm Q 8hr. Planning for vanco trough tomorrow to adjust for goal of 15-20  Fever= appears to have resolved  Maelys Kinnick [facility] Cell: [phone] Pager: [phone]  08/22/2015, 3:40 PM       

## 2015-08-22 NOTE — Progress Notes (Signed)
    [facility] has been requested to perform a transesophageal echocardiogram on Chase Stuart for bacteremia.  After careful review of history and examination, the risks and benefits of transesophageal echocardiogram have been explained including risks of esophageal damage, perforation (1:10,000 risk), bleeding, pharyngeal hematoma as well as other potential complications associated with conscious sedation including aspiration, arrhythmia, respiratory failure and death. Alternatives to treatment were discussed, questions were answered. Patient is willing to proceed.   Labs stable with Hgb of 10.7. Platelets 275. BP stable with no requirement for pressors.   Ellsworth LennoxBrittany M Strader, PA-C 08/22/2015 6:47 PM

## 2015-08-22 NOTE — Transfer of Care (Signed)
Immediate Anesthesia Transfer of Care Note  Patient: Chase Stuart  Procedure(s) Performed: Procedure(s): INCISION AND DRAINAGE SUBMENTAL ABSCESS  (N/A)  Patient Location: PACU  Anesthesia Type:General  Level of Consciousness: awake, alert  and oriented  Airway & Oxygen Therapy: Patient Spontanous Breathing and Patient connected to nasal cannula oxygen  Post-op Assessment: Report given to RN and Post -op Vital signs reviewed and stable  Post vital signs: Reviewed and stable  Last Vitals:  Filed Vitals:   08/21/15 2237 08/22/15 0454  BP: 125/83 154/85  Pulse: 79 81  Temp: 36.6 C 36.8 C  Resp: 19 20    Last Pain:  Filed Vitals:   08/22/15 0455  PainSc: 7       Patients Stated Pain Goal: 2 (08/18/15 2120)  Complications: No apparent anesthesia complications

## 2015-08-22 NOTE — Anesthesia Preprocedure Evaluation (Signed)
Anesthesia Evaluation  Patient identified by MRN, date of birth, ID band Patient awake    Reviewed: Allergy & Precautions, NPO status , Patient's Chart, lab work & pertinent test results  Airway Mallampati: II  TM Distance: >3 FB Neck ROM: Full    Dental no notable dental hx.    Pulmonary pneumonia,    Pulmonary exam normal breath sounds clear to auscultation       Cardiovascular negative cardio ROS Normal cardiovascular exam Rhythm:Regular Rate:Normal     Neuro/Psych negative neurological ROS  negative psych ROS   GI/Hepatic negative GI ROS, Neg liver ROS,   Endo/Other  negative endocrine ROS  Renal/GU negative Renal ROS     Musculoskeletal negative musculoskeletal ROS (+)   Abdominal   Peds  Hematology negative hematology ROS (+)   Anesthesia Other Findings   Reproductive/Obstetrics                             Anesthesia Physical Anesthesia Plan  ASA: I  Anesthesia Plan: General   Post-op Pain Management:    Induction: Intravenous  Airway Management Planned: Nasal ETT  Additional Equipment:   Intra-op Plan:   Post-operative Plan: Extubation in OR  Informed Consent: I have reviewed the patients History and Physical, chart, labs and discussed the procedure including the risks, benefits and alternatives for the proposed anesthesia with the patient or authorized representative who has indicated his/her understanding and acceptance.   Dental advisory given  Plan Discussed with: CRNA  Anesthesia Plan Comments:         Anesthesia Quick Evaluation

## 2015-08-22 NOTE — Interval H&P Note (Signed)
History and Physical Interval Note:  08/22/2015 9:49 AM  Chase Stuart  has presented today for surgery, with the diagnosis of SUBMENTAL ABSCESS   The various methods of treatment have been discussed with the patient and family. After consideration of risks, benefits and other options for treatment, the patient has consented to  Procedure(s): INCISION AND DRAINAGE SUBMENTAL ABSCESS  (N/A) as a surgical intervention .  The patient's history has been re-reviewed, patient re-examined, no change in status, stable for surgery.  I have re-reviewed the patient's chart and labs.  Questions were answered to the patient's satisfaction.     Flo ShanksWOLICKI, Raeghan Demeter

## 2015-08-22 NOTE — Progress Notes (Signed)
Pharmacy Antibiotic Note  Chase RougeOliver Stuart is a 45 y.o. male admitted on 08/18/2015 with MRSA bacteremia, jaw cellulitis Vancomycin trough low this afternoon at 6 (goal trough of 15 to 20 mcg / dL)  Plan: Increase Vancomycin to 1 gram iv Q 8 hours Continue to follow   Height: 5\' 11"  (180.3 cm) Weight: 201 lb 8 oz (91.4 kg) IBW/kg (Calculated) : 75.3  Temp (24hrs), Avg:98.1 F (36.7 C), Min:97.8 F (36.6 C), Max:98.5 F (36.9 C)   Recent Labs Lab 08/17/15 0321  08/18/15 1032 08/18/15 1648 08/19/15 0803 08/20/15 0557 08/21/15 0550 08/22/15 0526 08/22/15 1204  WBC  --   < >  --  12.8* 12.5* 13.1* 11.3* 10.5  --   CREATININE  --   < >  --  1.25* 1.18 1.11 0.81 0.85  --   LATICACIDVEN 0.69  --  1.80  --   --   --   --   --   --   VANCOTROUGH  --   --   --   --   --   --   --   --  6*  < > = values in this interval not displayed.  Estimated Creatinine Clearance: 124.2 mL/min (by C-G formula based on Cr of 0.85).    No Known Allergies  Thank you for allowing pharmacy to be a part of this patient's care. Okey RegalLisa Jillana Selph, PharmD [phone]  08/22/2015 1:31 PM

## 2015-08-22 NOTE — Anesthesia Postprocedure Evaluation (Signed)
Anesthesia Post Note  Patient: Chase Stuart  Procedure(s) Performed: Procedure(s) (LRB): INCISION AND DRAINAGE SUBMENTAL ABSCESS  (N/A)  Patient location during evaluation: PACU Anesthesia Type: General Level of consciousness: sedated and patient cooperative Pain management: pain level controlled Vital Signs Assessment: post-procedure vital signs reviewed and stable Respiratory status: spontaneous breathing Cardiovascular status: stable Anesthetic complications: no    Last Vitals:  Filed Vitals:   08/22/15 1239 08/22/15 1256  BP: 122/83 129/98  Pulse: 80 89  Temp: 36.7 C 36.7 C  Resp: 24 18    Last Pain:  Filed Vitals:   08/22/15 1256  PainSc: 0-No pain                 Lewie LoronJohn Winston Misner

## 2015-08-22 NOTE — Progress Notes (Signed)
Patient's surgical PCR is positive for MRSA and staph.  Veatrice KellsMahmoud,Altair Appenzeller I, RN

## 2015-08-22 NOTE — Op Note (Signed)
08/22/2015  10:39 AM    Genice RougeWilliams, Raegan  [id number]   Pre-Op Dx:  Mental/submental abscess/carbuncle.  Submental lymphadenopathy  Post-op Dx: same  Proc: Incision and drainage mental/submental abscess   Surg:  Flo ShanksWOLICKI, Dontrail Blackwell T MD  Anes:  GLMA  EBL:  min  Comp:  none  Findings:  Multiple subcutaneous loculations, LEFT>RIGHT.  Two areas with skin breakdown and drainage.  Enlarged submental nodes without obvious suppuration.   Procedure: With the patient in a comfortable supine position, general LMA anesthesia was administered.  At an appropriate level,  The patient was placed in a slight sitting position. The neck was examined with the findings as described above.  A sterile preparation and draping of the anterior neck was performed.  A 3 cm incision was made in a submental crease and carried through skin and subcutaneous tissues. Using a hemostat, the subcutaneous tissues were explored including breaking down small loculations and communicating with the 2 skin openings on the right side. Additional exploration down into the region of the submental nodes was performed. Cultures were taken for aerobes and anaerobes.  The wound was thoroughly irrigated with saline.  The wound was packed with 1/2 inch iodoform Nu Gauze including bringing a small end out of the superior skin opening.  The open wound Was closed Closed with a single 2-0 silk suture. Including securing the end of the Nu Gauze.  Hemostasis was observed. The neck was cleaned and a four-inch Kerlix wrap was applied. Procedure was completed and the patient was returned to anesthesia, awakened, extubated, and transferred to recovery in stable condition.   Dispo: PACU to inpatient bed  Plan:  Await cultures. Will probably remove the Nu Gauze packing in 2 days. Absorbent dressing.  Cephus RicherWOLICKI,  Alexsis Branscom T MD

## 2015-08-22 NOTE — Progress Notes (Signed)
[facility] Progress Note  Patient: Chase Stuart WUJ:[id number]   PCP: No PCP Per Patient DOB: 27-Mar-1970   DOA: 08/18/2015   DOS: 08/22/2015   Date of Service: the patient was seen and examined on 08/22/2015  Subjective: Patient tolerated debridement of the neck well. Denies any acute complaint. Fever appears to be subsiding. Pain is also getting better. No nausea no vomiting or diarrhea.. Nutrition: Tolerating oral diet  Brief hospital course: Pt. admitted on 08/18/2015, with complaint of left-sided chest pain as well as facial swelling, was found to have cavitary pneumonia, cellulitis of the face. Blood cultures positive for MRSA. Infectious diseases will follow up. Currently further plan is further defined etiology including IV antibiotics.  Assessment and Plan: 1. Sepsis due to methicillin resistant Staphylococcus aureus (MRSA) ([place])  Cellulitis of neck, Cavitary lung lesions MRSA bacteremia.  CT neck shows cervical lymphadenopathy, tonsillar enlargement, inflammation of the skin and soft tissue of the neck as well as cavitary lesions of the lung. With tachycardia, fever, leukocytosis patient meeting sepsis criteria on admission.  Blood cultures on admission positive for MRSA, repeat blood cultures negative for 24 hours. Elevated ESR CRP, normal ANA ANCA. Antibiotic will be narrowed to vancomycin. Infectious diseases consulted, appreciate input. Echocardiogram does not show any evidence of vegetation. IV hydration resume. TEE scheduled on Friday, 08/23/2015. Consulted ENT for neck abscess, highly appreciate their input. 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 Cultures are sent out for further workup. ENT is planning to perform a re-look procedure on 08/24/2015. Blood cultures negative for 48 hours for repeat, will order PICC line.  Patient's symptom onset was fairly rapid over last 3 days and patient mentions he was relatively healthy 3  days ago. Denies any weight loss denies any night sweats denies any low-grade temperature. Denies any travel outside of [place] or exposure to patient with TB endemic countries. At this point differential for the cavitary lung lesion does not include tuberculosis.  2. Hypokalemia. Stable.  3. Anemia. No active bleeding, likely dilutional and chronic. Continue to monitor.  4. Acute kidney injury. Serum creatinine on admission 1.44. Improved with IV hydration and treatment. We'll continue to closely monitor. Likely prerenal in the setting of infection.  ATN is also a possibility due to sepsis, but rapid improvement rules out that diagnosis.  5. Pleuritic chest pain. CT PE protocol is negative for any pulmonary embolism. This is all likely in the setting of septic emboli.  Pain management: When necessary Tylenol, when necessary ibuprofen Activity: Currently dependent no requirement for physical therapy Bowel regimen: last BM 08/19/2015 Diet: Regular diet DVT Prophylaxis: subcutaneous Heparin  Advance goals of care discussion: Full code  Family Communication: no family was present at bedside, at the time of interview.   Disposition:  Discharge to home. Expected discharge date: 08/24/2015, resolution of bacteremia  Consultants: Infectious disease, cardiology for TEE, ENT for abscess Procedures: Echocardiogram  Antibiotics: Anti-infectives    Start     Dose/Rate Route Frequency Ordered Stop   08/22/15 1400  vancomycin (VANCOCIN) IVPB 1000 mg/200 mL premix     1,000 mg 200 mL/hr over 60 Minutes Intravenous Every 8 hours 08/22/15 1330     08/18/15 2200  ceFEPIme (MAXIPIME) 1 g in dextrose 5 % 50 mL IVPB  Status:  Discontinued     1 g 100 mL/hr over 30 Minutes Intravenous Every 12 hours 08/18/15 1636 08/18/15 1638   08/18/15 2100  ceFEPIme (MAXIPIME) 1 g in dextrose 5 %  50 mL IVPB  Status:  Discontinued     1 g 100 mL/hr over 30 Minutes Intravenous Every 8 hours 08/18/15  1638 08/20/15 1426   08/18/15 1300  vancomycin (VANCOCIN) IVPB 1000 mg/200 mL premix  Status:  Discontinued     1,000 mg 200 mL/hr over 60 Minutes Intravenous Every 12 hours 08/18/15 1234 08/22/15 1330   08/18/15 1215  ceFEPIme (MAXIPIME) 1 g in dextrose 5 % 50 mL IVPB     1 g 100 mL/hr over 30 Minutes Intravenous  Once 08/18/15 1201 08/18/15 1243   08/18/15 1207  ceFEPIme (MAXIPIME) 1 g injection    Comments:  Hodgin, Georgianna  : cabinet override      08/18/15 1207 08/19/15 0014   08/18/15 1145  clindamycin (CLEOCIN) IVPB 600 mg  Status:  Discontinued     600 mg 100 mL/hr over 30 Minutes Intravenous  Once 08/18/15 1134 08/18/15 1201        Intake/Output Summary (Last 24 hours) at 08/22/15 1620 Last data filed at 08/22/15 1353  Gross per 24 hour  Intake   1840 ml  Output      2 ml  Net   1838 ml   Filed Weights   08/18/15 2028 08/19/15 2017 08/21/15 2237  Weight: 84.188 kg (185 lb 9.6 oz) 92 kg (202 lb 13.2 oz) 91.4 kg (201 lb 8 oz)    Objective: Physical Exam: Filed Vitals:   08/22/15 1215 08/22/15 1230 08/22/15 1239 08/22/15 1256  BP: 129/86 130/85 122/83 129/98  Pulse: 90 77 80 89  Temp:   98 F (36.7 C) 98.1 F (36.7 C)  TempSrc:    Oral  Resp: _0 Height:      Weight:      SpO2: 93% 93% 94% 96%    General: Alert, Awake and Oriented to Time, Place and Person. Appear in mild distress Eyes: PERRL, Conjunctiva normal ENT: Oral Mucosa clear moist. Neck: no JVD, no Abnormal Mass Or lumps Cardiovascular: S1 and S2 Present, no Murmur, Respiratory: Bilateral Air entry equal and Decreased, Clear to Auscultation, no Crackles, no wheezes Abdomen: Bowel Sound present, Soft and no tenderness Skin: no redness, no Rash  Extremities: no Pedal edema, no calf tenderness Neurologic: Grossly no focal neuro deficit. Bilaterally Equal motor strength  Data Reviewed: CBC:  Recent Labs Lab 08/17/15 0310 08/18/15 1015 08/18/15 1648 08/19/15 0803 08/20/15 0557  08/21/15 0550 08/22/15 0526  WBC 11.5* 11.9* 12.8* 12.5* 13.1* 11.3* 10.5  NEUTROABS 9.6* 9.1*  --   --  10.8*  --   --   HGB 13.6 14.0 13.5 12.4* 11.9* 10.7* 11.6*  HCT 39.1 40.2 39.2 37.2* 34.7* 32.1* 35.4*  MCV 91.8 91.6 92.0 90.1 91.6 88.9 91.9  PLT 206 219 204 216 211 275 825   Basic Metabolic Panel:  Recent Labs Lab 08/18/15 1015 08/18/15 1648 08/19/15 0803 08/20/15 0557 08/21/15 0550 08/22/15 0526  NA 132*  --  130* 135 133* 138  K 4.1  --  4.0 3.5 3.1* 3.7  CL 96*  --  97* 102 102 103  CO2 27  --  _1 GLUCOSE 132*  --  124* 123* 113* 95  BUN 15  --  _2 CREATININE 1.33* 1.25* 1.18 1.11 0.81 0.85  CALCIUM 8.6*  --  8.3* 8.4* 7.9* 8.7*  MG  --   --   --  2.0  --  2.0    Liver Function  Tests:  Recent Labs Lab 08/20/15 0557  AST 16  ALT 21  ALKPHOS 76  BILITOT 0.7  PROT 5.8*  ALBUMIN 2.5*   No results for input(s): LIPASE, AMYLASE in the last 168 hours. No results for input(s): AMMONIA in the last 168 hours. Coagulation Profile: No results for input(s): INR, PROTIME in the last 168 hours. Cardiac Enzymes: No results for input(s): CKTOTAL, CKMB, CKMBINDEX, TROPONINI in the last 168 hours. BNP (last 3 results) No results for input(s): PROBNP in the last 8760 hours.  CBG: No results for input(s): GLUCAP in the last 168 hours.  Studies: No results found.   Scheduled Meds: . Chlorhexidine Gluconate Cloth  6 each Topical Q0600  . enoxaparin (LOVENOX) injection  40 mg Subcutaneous Q24H  . famotidine  20 mg Oral Daily  . guaiFENesin  600 mg Oral BID  . mupirocin ointment  1 application Nasal BID  . vancomycin  1,000 mg Intravenous Q8H   Continuous Infusions: . dextrose 5 % and 0.45% NaCl 125 mL/hr at 08/22/15 1254  . lactated ringers 20 mL/hr at 08/22/15 0915   PRN Meds: acetaminophen, ibuprofen, ondansetron (ZOFRAN) IV  Time spent: 30 minutes  Author: Berle Mull, MD [facility] Pager: [phone] 08/22/2015 4:20  PM  If 7PM-7AM, please contact night-coverage at [email or web address], password [email or web address]

## 2015-08-22 NOTE — Anesthesia Procedure Notes (Signed)
Procedure Name: LMA Insertion Date/Time: 08/22/2015 10:09 AM Performed by: Daiva EvesAVENEL, Czar Ysaguirre W Pre-anesthesia Checklist: Patient identified, Emergency Drugs available, Patient being monitored, Timeout performed and Suction available Patient Re-evaluated:Patient Re-evaluated prior to inductionOxygen Delivery Method: Circle system utilized Preoxygenation: Pre-oxygenation with 100% oxygen Intubation Type: IV induction LMA: LMA inserted LMA Size: 5.0 Number of attempts: 1 Placement Confirmation: positive ETCO2,  CO2 detector and breath sounds checked- equal and bilateral Tube secured with: Tape Dental Injury: Teeth and Oropharynx as per pre-operative assessment

## 2015-08-23 ENCOUNTER — Encounter (HOSPITAL_COMMUNITY)
Admission: EM | Disposition: A | Discharge status: Home Health | Payer: Self-pay | Source: Home / Self Care | Attending: Internal Medicine

## 2015-08-23 ENCOUNTER — Encounter (HOSPITAL_COMMUNITY): Payer: Self-pay

## 2015-08-23 ENCOUNTER — Inpatient Hospital Stay (HOSPITAL_COMMUNITY): Payer: BLUE CROSS/BLUE SHIELD

## 2015-08-23 DIAGNOSIS — M25571 Pain in right ankle and joints of right foot: Secondary | ICD-10-CM

## 2015-08-23 DIAGNOSIS — R7881 Bacteremia: Secondary | ICD-10-CM

## 2015-08-23 DIAGNOSIS — J984 Other disorders of lung: Secondary | ICD-10-CM | POA: Clinically undetermined

## 2015-08-23 DIAGNOSIS — J189 Pneumonia, unspecified organism: Secondary | ICD-10-CM

## 2015-08-23 DIAGNOSIS — R0781 Pleurodynia: Secondary | ICD-10-CM | POA: Insufficient documentation

## 2015-08-23 DIAGNOSIS — N179 Acute kidney failure, unspecified: Secondary | ICD-10-CM | POA: Diagnosis present

## 2015-08-23 HISTORY — PX: TEE WITHOUT CARDIOVERSION: SHX5443

## 2015-08-23 LAB — BASIC METABOLIC PANEL
Anion gap: 7 (ref 5–15)
BUN: 9 mg/dL (ref 6–20)
CHLORIDE: 101 mmol/L (ref 101–111)
CO2: 29 mmol/L (ref 22–32)
CREATININE: 0.65 mg/dL (ref 0.61–1.24)
Calcium: 8.6 mg/dL — ABNORMAL LOW (ref 8.9–10.3)
GFR calc Af Amer: >60 mL/min (ref >60)
GFR calc non Af Amer: >60 mL/min (ref >60)
GLUCOSE: 100 mg/dL — AB (ref 65–99)
POTASSIUM: 4 mmol/L (ref 3.5–5.1)
Sodium: 137 mmol/L (ref 135–145)

## 2015-08-23 SURGERY — ECHOCARDIOGRAM, TRANSESOPHAGEAL
Anesthesia: Moderate Sedation

## 2015-08-23 MED ORDER — FENTANYL CITRATE (PF) 100 MCG/2ML IJ SOLN
INTRAMUSCULAR | Status: DC | PRN
  Administered 2015-08-23 (×2): 25 ug via INTRAVENOUS

## 2015-08-23 MED ORDER — SODIUM CHLORIDE 0.9% FLUSH
10.0000 mL | Freq: Two times a day (BID) | INTRAVENOUS | Status: DC
Start: 2015-08-23 — End: 2015-08-24

## 2015-08-23 MED ORDER — FENTANYL CITRATE (PF) 100 MCG/2ML IJ SOLN
INTRAMUSCULAR | Status: AC
  Filled 2015-08-23: qty 2

## 2015-08-23 MED ORDER — BUTAMBEN-TETRACAINE-BENZOCAINE 2-2-14 % EX AERO
INHALATION_SPRAY | CUTANEOUS | Status: DC | PRN
  Administered 2015-08-23: 2 via TOPICAL

## 2015-08-23 MED ORDER — LIDOCAINE VISCOUS 2 % MT SOLN
OROMUCOSAL | Status: AC
  Filled 2015-08-23: qty 15

## 2015-08-23 MED ORDER — SACCHAROMYCES BOULARDII 250 MG PO CAPS
250.0000 mg | ORAL_CAPSULE | Freq: Two times a day (BID) | ORAL | Status: DC
  Administered 2015-08-23 – 2015-08-24 (×2): 250 mg via ORAL
  Filled 2015-08-23 (×2): qty 1

## 2015-08-23 MED ORDER — SODIUM CHLORIDE 0.9 % IV SOLN: INTRAVENOUS | Status: DC

## 2015-08-23 MED ORDER — SODIUM CHLORIDE 0.9% FLUSH
10.0000 mL | INTRAVENOUS | Status: DC | PRN
  Administered 2015-08-23: 10 mL
  Filled 2015-08-23: qty 40

## 2015-08-23 MED ORDER — MIDAZOLAM HCL 10 MG/2ML IJ SOLN
INTRAMUSCULAR | Status: DC | PRN
  Administered 2015-08-23 (×2): 2 mg via INTRAVENOUS

## 2015-08-23 MED ORDER — MIDAZOLAM HCL 5 MG/ML IJ SOLN
INTRAMUSCULAR | Status: AC
  Filled 2015-08-23: qty 2

## 2015-08-23 NOTE — H&P (View-Only) (Signed)
[facility]    Date of Admission:  08/18/2015   Total days of antibiotics 4        Day 4 vanco           ID: Chase RougeOliver Brinegar is a 45 y.o. male with  MRSA bacteremia, septic emboli to neck and deep tissue infection to chin/neck  Principal Problem:   Sepsis due to methicillin resistant Staphylococcus aureus (MRSA) ([facility]) Active Problems:   Pneumonia   Cellulitis of neck   Reactive cervical lymphadenopathy   MRSA bacteremia   Facial abscess    Subjective: Afebrile,  24hr event: underwent irrigation,debridement, exploration of carbuncle/furuncles of mental/submental region. subtherapeutic on vanco  Medications:  . Chlorhexidine Gluconate Cloth  6 each Topical Q0600  . enoxaparin (LOVENOX) injection  40 mg Subcutaneous Q24H  . famotidine  20 mg Oral Daily  . guaiFENesin  600 mg Oral BID  . mupirocin ointment  1 application Nasal BID  . vancomycin  1,000 mg Intravenous Q8H    Objective: Vital signs in last 24 hours: Temp:  [97.8 F (36.6 C)-98.5 F (36.9 C)] 98.1 F (36.7 C) (06/29 1256) Pulse Rate:  [77-99] 89 (06/29 1256) Resp:  [18-30] 18 (06/29 1256) BP: (107-154)/(79-98) 129/98 mmHg (06/29 1256) SpO2:  [87 %-96 %] 96 % (06/29 1256) Weight:  [201 lb 8 oz (91.4 kg)] 201 lb 8 oz (91.4 kg) (06/28 2237) Physical Exam  Constitutional: He is oriented to person, place, and time. He appears well-developed and well-nourished. No distress.  HENT:  Mouth/Throat: Oropharynx is clear and moist. No oropharyngeal exudate.  Cardiovascular: Normal rate, regular rhythm and normal heart sounds. Exam reveals no gallop and no friction rub.  No murmur heard.  Pulmonary/Chest: Effort normal and breath sounds normal. No respiratory distress. He has no wheezes.  Abdominal: Soft. Bowel sounds are normal. He exhibits no distension. There is no tenderness.  Lymphadenopathy:  He has no cervical adenopathy.  Neurological: He is alert and oriented to person, place,  and time.  Skin: area of neck is wrapped Psychiatric: He has a normal mood and affect. His behavior is normal.    Lab Results  Recent Labs  08/21/15 0550 08/22/15 0526  WBC 11.3* 10.5  HGB 10.7* 11.6*  HCT 32.1* 35.4*  NA 133* 138  K 3.1* 3.7  CL 102 103  CO2 24 27  BUN 8 8  CREATININE 0.81 0.85   Liver Panel  Recent Labs  08/20/15 0557  PROT 5.8*  ALBUMIN 2.5*  AST 16  ALT 21  ALKPHOS 76  BILITOT 0.7   Sedimentation Rate  Recent Labs  08/19/15 1555  ESRSEDRATE 48*   C-Reactive Protein No results for input(s): CRP in the last 72 hours.  Microbiology: 6/27 blood cx ngtd 6/26 blood cx MRSA in 1 of 2 sets 6/29 wound cx GPCC on gram stain Studies/Results: No results found.   Assessment/Plan: Complicated MRSA bacteremia - concern that he still may have endocarditis despite TTE negative. Please get TEE. Repeat cx thus far ngtd at 24hr  Deep tissue abscess to face = appreciate dr. Lazarus Salineswolicki for doing debridement of submandibular/submental lesion  pleuretic chest pain = likely referred pain from septic emboli  TDM = vancomycin level is low and vancomycin adjusted to 1gm Q 8hr. Planning for vanco trough tomorrow to adjust for goal of 15-20  Fever= appears to have resolved  Ani Deoliveira, [facility] Cell: [phone] Pager: [phone]  08/22/2015, 3:40 PM

## 2015-08-23 NOTE — CV Procedure (Signed)
See full TEE report in [employer]. Patient sedated with fentanyl 50 micrograms and versed 4 mg IV. Normal LV function; no vegetations. 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 Full report to follow. Olga MillersBrian Crenshaw, MD

## 2015-08-23 NOTE — Care Management Note (Signed)
Case Management Note  Patient Details  Name: Chase Stuart MRN: [id number] Date of Birth: March 15, 1970  Subjective/Objective:            CM following for progression and d/c planning.         Action/Plan: 08/23/2015 Pt to d/c with IV Vancomycin, [facility] notified and following . Await final dosage.   Expected Discharge Date:                  Expected Discharge Plan:  Home w Home Health Services  In-House Referral:  NA  Discharge planning Services  CM Consult  Post Acute Care Choice:  Durable Medical Equipment, Home Health Choice offered to:  Patient  DME Arranged:  IV pump/equipment DME Agency:  [facility]  [facility]  Status of Service:  In process, will continue to follow  If discussed at Long Length of Stay Meetings, dates discussed:    Additional Comments:  [name], [name], RN 08/23/2015, 2:51 PM

## 2015-08-23 NOTE — Progress Notes (Signed)
[facility] Progress Note  Patient: Chase Stuart AJO:[id number]   PCP: No PCP Per Patient DOB: [birth year]   DOA: 08/18/2015   DOS: 08/23/2015   Date of Service: the patient was seen and examined on 08/23/2015  Subjective: Patient complains of 2 episodes of loose watery bowel movement. No abdominal pain. No nausea no vomiting. No fever and chest pain is also resolved. Patient complain some pain on the left heel. Nutrition: Tolerating oral diet  Brief hospital course: Pt. admitted on 08/18/2015, with complaint of left-sided chest pain as well as facial swelling, was found to have cavitary pneumonia, cellulitis of the face. Blood cultures positive for MRSA. Infectious diseases was consulted. TTE as well as TEE are negative for any vegetation. ENT was consulted for incision and debridement of the local abscess. Repeat blood cultures negative for more than 48 hours and the PICC line order is placed. Currently further plan is continue IV antibiotics and identify the dose, follow recommendation from ENT for further debridement.  Assessment and Plan: 1. Sepsis due to methicillin resistant Staphylococcus aureus (MRSA) ([place])  Cellulitis of neck, Cavitary lung lesions MRSA bacteremia.  CT neck shows cervical lymphadenopathy, tonsillar enlargement, inflammation of the skin and soft tissue of the neck as well as cavitary lesions of the lung. With tachycardia, fever, leukocytosis patient meeting sepsis criteria on admission.  Blood cultures on admission positive for MRSA, repeat blood cultures negative for 48 hours. Elevated ESR CRP, normal ANA ANCA. Infectious diseases consulted, appreciate input. TTE and TEE does not show any evidence of vegetation. Consulted ENT for neck abscess, highly appreciate their input. 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 Abscess Cultures are sent, growing gram-positive cocci in cluster.  Antibiotic narrowed to vancomycin. Repeat  trough on 08/23/2015. Duration of antibiotic is 2 weeks from negative culture. See ID note. ENT is planning to perform a re-look procedure on 08/24/2015. Ordered PICC line.  Patient's symptom onset was fairly rapid over last 3 days and patient mentions he was relatively healthy 3 days ago. Denies any weight loss denies any night sweats denies any low-grade temperature. Denies any travel outside of [place] or exposure to patient with TB endemic countries. At this point differential for the cavitary lung lesion does not include tuberculosis.  2. Hypokalemia. Stable.  3. Anemia. No active bleeding, likely dilutional and chronic. Continue to monitor.  4. Acute kidney injury. Serum creatinine on admission 1.44. Improved with IV hydration and treatment. We'll continue to closely monitor. Likely prerenal in the setting of infection.  ATN is also a possibility due to sepsis, but rapid improvement rules out that diagnosis.  5. Pleuritic chest pain. CT PE protocol is negative for any pulmonary embolism. This is all likely in the setting of septic emboli.  6. Loose BM. 1 Add probiotics.  7. Right heel pain. No evidence of swelling or infection. Recommended to monitor.  Pain management: When necessary Tylenol, when necessary ibuprofen Activity: Currently dependent no requirement for physical therapy Bowel regimen: last BM 08/22/2015 Diet: Regular diet DVT Prophylaxis: subcutaneous Heparin  Advance goals of care discussion: Full code  Family Communication: family was present at bedside, at the time of interview.   Disposition:  Discharge to home with home health for IV vancomycin. Expected discharge date: 08/24/2015, pending ENT debridement and recommendation  Consultants: Infectious disease, cardiology for TEE, ENT for abscess Procedures: Echocardiogram, TEE  Antibiotics: Anti-infectives    Start     Dose/Rate Route Frequency Ordered Stop  08/22/15 1400  vancomycin  (VANCOCIN) IVPB 1000 mg/200 mL premix     1,000 mg 200 mL/hr over 60 Minutes Intravenous Every 8 hours 08/22/15 1330     08/18/15 2200  ceFEPIme (MAXIPIME) 1 g in dextrose 5 % 50 mL IVPB  Status:  Discontinued     1 g 100 mL/hr over 30 Minutes Intravenous Every 12 hours 08/18/15 1636 08/18/15 1638   08/18/15 2100  ceFEPIme (MAXIPIME) 1 g in dextrose 5 % 50 mL IVPB  Status:  Discontinued     1 g 100 mL/hr over 30 Minutes Intravenous Every 8 hours 08/18/15 1638 08/20/15 1426   08/18/15 1300  vancomycin (VANCOCIN) IVPB 1000 mg/200 mL premix  Status:  Discontinued     1,000 mg 200 mL/hr over 60 Minutes Intravenous Every 12 hours 08/18/15 1234 08/22/15 1330   08/18/15 1215  ceFEPIme (MAXIPIME) 1 g in dextrose 5 % 50 mL IVPB     1 g 100 mL/hr over 30 Minutes Intravenous  Once 08/18/15 1201 08/18/15 1243   08/18/15 1207  ceFEPIme (MAXIPIME) 1 g injection    Comments:  Hodgin, Georgianna  : cabinet override      08/18/15 1207 08/19/15 0014   08/18/15 1145  clindamycin (CLEOCIN) IVPB 600 mg  Status:  Discontinued     600 mg 100 mL/hr over 30 Minutes Intravenous  Once 08/18/15 1134 08/18/15 1201        Intake/Output Summary (Last 24 hours) at 08/23/15 1440 Last data filed at 08/23/15 1300  Gross per 24 hour  Intake   1200 ml  Output      0 ml  Net   1200 ml   Filed Weights   08/18/15 2028 08/19/15 2017 08/21/15 2237  Weight: 84.188 kg (185 lb 9.6 oz) 92 kg (202 lb 13.2 oz) 91.4 kg (201 lb 8 oz)    Objective: Physical Exam: Filed Vitals:   08/23/15 0830 08/23/15 0839 08/23/15 0840 08/23/15 0858  BP: 147/81  130/85 147/83  Pulse: 92  90 98  Temp:    98.6 F (37 C)  TempSrc:    Oral  Resp: 22  26 20   Height:      Weight:      SpO2: 94% 89% 92% 95%    General: Alert, Awake and Oriented to Time, Place and Person. Appear in mild distress Eyes: PERRL, Conjunctiva normal ENT: Oral Mucosa clear moist. Neck: no JVD, no Abnormal Mass Or lumps Cardiovascular: S1 and S2 Present, no  Murmur, Respiratory: Bilateral Air entry equal and Decreased, Clear to Auscultation, no Crackles, no wheezes Abdomen: Bowel Sound present, Soft and no tenderness Skin: no redness, no Rash  Extremities: no Pedal edema, no calf tenderness Neurologic: Grossly no focal neuro deficit. Bilaterally Equal motor strength  Data Reviewed: CBC:  Recent Labs Lab 08/17/15 0310 08/18/15 1015 08/18/15 1648 08/19/15 0803 08/20/15 0557 08/21/15 0550 08/22/15 0526  WBC 11.5* 11.9* 12.8* 12.5* 13.1* 11.3* 10.5  NEUTROABS 9.6* 9.1*  --   --  10.8*  --   --   HGB 13.6 14.0 13.5 12.4* 11.9* 10.7* 11.6*  HCT 39.1 40.2 39.2 37.2* 34.7* 32.1* 35.4*  MCV 91.8 91.6 92.0 90.1 91.6 88.9 91.9  PLT 206 219 204 216 211 275 798   Basic Metabolic Panel:  Recent Labs Lab 08/19/15 0803 08/20/15 0557 08/21/15 0550 08/22/15 0526 08/23/15 1032  NA 130* 135 133* 138 137  K 4.0 3.5 3.1* 3.7 4.0  CL 97* 102 102 103 101  CO2  25 25 24 27 29   GLUCOSE 124* 123* 113* 95 100*  BUN 11 9 8 8 9   CREATININE 1.18 1.11 0.81 0.85 0.65  CALCIUM 8.3* 8.4* 7.9* 8.7* 8.6*  MG  --  2.0  --  2.0  --     Liver Function Tests:  Recent Labs Lab 08/20/15 0557  AST 16  ALT 21  ALKPHOS 76  BILITOT 0.7  PROT 5.8*  ALBUMIN 2.5*   No results for input(s): LIPASE, AMYLASE in the last 168 hours. No results for input(s): AMMONIA in the last 168 hours. Coagulation Profile: No results for input(s): INR, PROTIME in the last 168 hours. Cardiac Enzymes: No results for input(s): CKTOTAL, CKMB, CKMBINDEX, TROPONINI in the last 168 hours. BNP (last 3 results) No results for input(s): PROBNP in the last 8760 hours.  CBG: No results for input(s): GLUCAP in the last 168 hours.  Studies: No results found.   Scheduled Meds: . Chlorhexidine Gluconate Cloth  6 each Topical Q0600  . enoxaparin (LOVENOX) injection  40 mg Subcutaneous Q24H  . famotidine  20 mg Oral Daily  . guaiFENesin  600 mg Oral BID  . mupirocin ointment  1  application Nasal BID  . vancomycin  1,000 mg Intravenous Q8H   Continuous Infusions: . lactated ringers 20 mL/hr at 08/22/15 0915   PRN Meds: acetaminophen, ibuprofen, ondansetron (ZOFRAN) IV  Time spent: 30 minutes  Author: Berle Mull, MD [facility] Pager: [phone] 08/23/2015 2:40 PM  If 7PM-7AM, please contact night-coverage at [email or web address], password [email or web address]

## 2015-08-23 NOTE — Progress Notes (Signed)
  Echocardiogram Echocardiogram Transesophageal has been performed.  Chase Stuart 08/23/2015, 8:44 AM

## 2015-08-23 NOTE — Progress Notes (Addendum)
[facility]    Date of Admission:  08/18/2015   Total days of antibiotics 5        Day 5 vanco           ID: Chase Stuart is a 45 y.o. male with  MRSA bacteremia, septic emboli to neck and deep tissue infection to chin/neck  Principal Problem:   Sepsis due to methicillin resistant Staphylococcus aureus (MRSA) (HCC) Active Problems:   Pneumonia   Cellulitis of neck   Reactive cervical lymphadenopathy   MRSA bacteremia   Facial abscess    Subjective: Afebrile, he reports having new onset of right 1st mtp joint pain. No hx of gout  24hr event: underwent TEE today which was negative for vegetation  Medications:  . Chlorhexidine Gluconate Cloth  6 each Topical Q0600  . enoxaparin (LOVENOX) injection  40 mg Subcutaneous Q24H  . famotidine  20 mg Oral Daily  . guaiFENesin  600 mg Oral BID  . mupirocin ointment  1 application Nasal BID  . vancomycin  1,000 mg Intravenous Q8H    Objective: Vital signs in last 24 hours: Temp:  [97.4 F (36.3 C)-98.6 F (37 C)] 98.6 F (37 C) (06/30 0858) Pulse Rate:  [77-98] 98 (06/30 0858) Resp:  [16-33] 20 (06/30 0858) BP: (120-154)/(65-103) 147/83 mmHg (06/30 0858) SpO2:  [89 %-96 %] 95 % (06/30 0858) Physical Exam  Constitutional: He is oriented to person, place, and time. He appears well-developed and well-nourished. No distress.  HENT:  Mouth/Throat: Oropharynx is clear and moist. No oropharyngeal exudate.  Cardiovascular: Normal rate, regular rhythm and normal heart sounds. Exam reveals no gallop and no friction rub.  No murmur heard.  Pulmonary/Chest: Effort normal and breath sounds normal. No respiratory distress. He has no wheezes.  Abdominal: Soft. Bowel sounds are normal. He exhibits no distension. There is no tenderness.  Lymphadenopathy:  He has no cervical adenopathy.  Neurological: He is alert and oriented to person, place, and time.  Skin: area of neck is wrapped Ext: right great toe  tenderness at MTP joint, mild erythema Psychiatric: He has a normal mood and affect. His behavior is normal.    Lab Results  Recent Labs  08/21/15 0550 08/22/15 0526 08/23/15 1032  WBC 11.3* 10.5  --   HGB 10.7* 11.6*  --   HCT 32.1* 35.4*  --   NA 133* 138 137  K 3.1* 3.7 4.0  CL 102 103 101  CO2 24 27 29   BUN 8 8 9   CREATININE 0.81 0.85 0.65   Lab Results  Component Value Date   ESRSEDRATE 48* 08/19/2015   Lab Results  Component Value Date   CRP 45.7* 08/19/2015    Microbiology: 6/27 blood cx ngtd 6/26 blood cx MRSA in 1 of 2 sets 6/29 wound cx staph aureus, sensitivities pending Studies/Results: No results found.   Assessment/Plan: Complicated MRSA bacteremia - TEE is negative. Though has pulmonary septic emboli plus deep tissue facial abscess s/p debridement. Plan on treating with IV vancomycin for 14 days. Using 6/30 as day 1 since he had his surgery that debrided facial abscess. Can place picc line. Current home dose still to be determined since he was subtherapeutic yesterday  Deep tissue abscess to face = appreciate dr. Lazarus Salineswolicki for doing debridement of submandibular/submental lesion. Defer to him for when packing needs to be changed  pleuretic chest pain/pneumonia = likely referred pain from septic emboli. Currently on vancomycin  TDM = vancomycin level was  low yesterday and vancomycin adjusted to 1gm Q 8hr. Planning for vanco trough today to adjust for goal of 15-20  Fever= appears to have resolved  Gouty flare? = has great toe MTP joint pain. Possibly gout vs. New site of infection. Consider getting xray   Will sign off. We will see the patient back in 2 wk in the clinic  -------------home health orders-------------------  Diagnosis: MRSA bacteremia  Culture Result: MRSA  No Known Allergies  Discharge antibiotics: Per pharmacy protocol  Aim for Vancomycin trough 15-20 (unless otherwise indicated) Duration: 2 wk End Date: July 13th  [facility]  [facility] Per Protocol:  Labs weekly while on IV antibiotics: x__ CBC with differential _x_ CMP  _x_ Vancomycin trough  Fax weekly labs to [phone]  Clinic Follow Up Appt: 2 wk  @ Dr Rogue BussingSnider   [name], [facility] Cell: [phone] Pager: [phone]  08/23/2015, 12:09 PM

## 2015-08-23 NOTE — Progress Notes (Signed)
Peripherally Inserted Central Catheter/Midline Placement  The IV Nurse has discussed with the patient and/or persons authorized to consent for the patient, the purpose of this procedure and the potential benefits and risks involved with this procedure.  The benefits include less needle sticks, lab draws from the catheter and patient may be discharged home with the catheter.  Risks include, but not limited to, infection, bleeding, blood clot (thrombus formation), and puncture of an artery; nerve damage and irregular heat beat.  Alternatives to this procedure were also discussed.  PICC/Midline Placement Documentation  PICC Single Lumen 08/23/15 PICC Right Basilic 46 cm 1 cm (Active)  Indication for Insertion or Continuance of Line Home intravenous therapies (PICC only) 08/23/2015  3:45 PM  Exposed Catheter (cm) 1 cm 08/23/2015  3:45 PM  Dressing Change Due 08/30/15 08/23/2015  3:45 PM       Ariauna Farabee, Lajean ManesKerry Loraine 08/23/2015, 3:48 PM

## 2015-08-23 NOTE — Interval H&P Note (Signed)
History and Physical Interval Note:  08/23/2015 7:49 AM  Chase Stuart  has presented today for surgery, with the diagnosis of BACTEREMIA  The various methods of treatment have been discussed with the patient and family. After consideration of risks, benefits and other options for treatment, the patient has consented to  Procedure(s): TRANSESOPHAGEAL ECHOCARDIOGRAM (TEE) (N/A) as a surgical intervention .  The patient's history has been reviewed, patient examined, no change in status, stable for surgery.  I have reviewed the patient's chart and labs.  Questions were answered to the patient's satisfaction.     Olga MillersBrian Khristen Cheyney

## 2015-08-24 DIAGNOSIS — A4102 Sepsis due to Methicillin resistant Staphylococcus aureus: Principal | ICD-10-CM

## 2015-08-24 LAB — BASIC METABOLIC PANEL
Anion gap: 7 (ref 5–15)
BUN: 8 mg/dL (ref 6–20)
CALCIUM: 8.7 mg/dL — AB (ref 8.9–10.3)
CHLORIDE: 102 mmol/L (ref 101–111)
CO2: 29 mmol/L (ref 22–32)
CREATININE: 0.98 mg/dL (ref 0.61–1.24)
GFR calc non Af Amer: >60 mL/min (ref >60)
Glucose, Bld: 111 mg/dL — ABNORMAL HIGH (ref 65–99)
Potassium: 3.9 mmol/L (ref 3.5–5.1)
SODIUM: 138 mmol/L (ref 135–145)

## 2015-08-24 LAB — CBC WITH DIFFERENTIAL/PLATELET
BASOS PCT: 0 %
Basophils Absolute: 0 10*3/uL (ref 0.0–0.1)
EOS ABS: 0.5 10*3/uL (ref 0.0–0.7)
Eosinophils Relative: 4 %
HCT: 36 % — ABNORMAL LOW (ref 39.0–52.0)
HEMOGLOBIN: 11.7 g/dL — AB (ref 13.0–17.0)
Lymphocytes Relative: 13 %
Lymphs Abs: 1.7 10*3/uL (ref 0.7–4.0)
MCH: 30.5 pg (ref 26.0–34.0)
MCHC: 32.5 g/dL (ref 30.0–36.0)
MCV: 93.8 fL (ref 78.0–100.0)
MONOS PCT: 7 %
Monocytes Absolute: 0.9 10*3/uL (ref 0.1–1.0)
NEUTROS PCT: 76 %
Neutro Abs: 9.9 10*3/uL — ABNORMAL HIGH (ref 1.7–7.7)
Platelets: 519 10*3/uL — ABNORMAL HIGH (ref 150–400)
RBC: 3.84 MIL/uL — ABNORMAL LOW (ref 4.22–5.81)
RDW: 13.4 % (ref 11.5–15.5)
WBC: 13.1 10*3/uL — AB (ref 4.0–10.5)

## 2015-08-24 LAB — CULTURE, BLOOD (ROUTINE X 2): CULTURE: NO GROWTH

## 2015-08-24 LAB — VANCOMYCIN, TROUGH: VANCOMYCIN TR: 15 ug/mL (ref 15–20)

## 2015-08-24 MED ORDER — VANCOMYCIN HCL IN DEXTROSE 1-5 GM/200ML-% IV SOLN: 1000.0000 mg | Freq: Three times a day (TID) | INTRAVENOUS | Status: DC

## 2015-08-24 MED ORDER — MUPIROCIN 2 % EX OINT: 1.0000 "application " | TOPICAL_OINTMENT | Freq: Two times a day (BID) | CUTANEOUS | Status: AC

## 2015-08-24 MED ORDER — GUAIFENESIN ER 600 MG PO TB12: 600.0000 mg | ORAL_TABLET | Freq: Two times a day (BID) | ORAL | Status: AC

## 2015-08-24 MED ORDER — HEPARIN SOD (PORK) LOCK FLUSH 100 UNIT/ML IV SOLN
250.0000 [IU] | INTRAVENOUS | Status: AC | PRN
  Administered 2015-08-24: 250 [IU]

## 2015-08-24 MED ORDER — VANCOMYCIN HCL IN DEXTROSE 1-5 GM/200ML-% IV SOLN: 1000.0000 mg | Freq: Three times a day (TID) | INTRAVENOUS | Status: AC

## 2015-08-24 NOTE — Progress Notes (Addendum)
Pharmacy Antibiotic Note  Chase Stuart is a 45 y.o. male admitted on 08/18/2015 with MRSA bacteremia, jaw cellulitis. SCr relatively stable.  Vancomycin trough 15 mcg/ml (therapeutic for goal trough of 15 to 20 mcg/mL)  Plan: Continue Vancomycin 1 gram IV Q 8 hours Will f/u renal function Weekly trough planning 2 weeks of treatment (6/30 is day #1) so end date 7/13   Height: 5\' 11"  (180.3 cm) Weight: 201 lb 8 oz (91.4 kg) IBW/kg (Calculated) : 75.3  Temp (24hrs), Avg:98.3 F (36.8 C), Min:97.6 F (36.4 C), Max:98.6 F (37 C)   Recent Labs Lab 08/18/15 1032 08/18/15 1648 08/19/15 0803 08/20/15 0557 08/21/15 0550 08/22/15 0526 08/22/15 1204 08/23/15 1032 08/24/15 0535  WBC  --  12.8* 12.5* 13.1* 11.3* 10.5  --   --   --   CREATININE  --  1.25* 1.18 1.11 0.81 0.85  --  0.65 0.98  LATICACIDVEN 1.80  --   --   --   --   --   --   --   --   VANCOTROUGH  --   --   --   --   --   --  6*  --  15    Estimated Creatinine Clearance: 107.7 mL/min (by C-G formula based on Cr of 0.98).    No Known Allergies   Vancomycin 6/25>  Cefepime 6/25>6/27   BC x 2 6/26> MRSA  BC x 2 6/27>NGTD  Wound (chin area)>Staph aureus    Christoper Fabianaron Bellamarie Pflug, PharmD, BCPS Clinical pharmacist, pager [phone]  08/24/2015 6:25 AM

## 2015-08-24 NOTE — Progress Notes (Signed)
Patient discharge teaching given, including activity, diet, follow-up appoints, and medications. Patient verbalized understanding of all discharge instructions. IV access was d/c'd.  PICC in place for IV antibiotics with home health.  Neck dressing changed before discharge.  Vitals are stable. Skin is intact except as charted in most recent assessments. Pt to be escorted out by NT, to be driven home by family.  Peri MarisAndrew Kristien Salatino, MBA, BSN, RN

## 2015-08-24 NOTE — Progress Notes (Signed)
   ENT Progress Note: POD #2 s/p Procedure(s): Incision and drainage of mental abscess   Subjective: The patient reports significant improvement in discomfort, minimal discharge.  Objective: Vital signs in last 24 hours: Temp:  [97.6 F (36.4 C)-98.6 F (37 C)] 98.1 F (36.7 C) (07/01 0930) Pulse Rate:  [75-87] 82 (07/01 0930) Resp:  [18-20] 18 (07/01 0930) BP: (131-143)/(79-87) 131/83 mmHg (07/01 0930) SpO2:  [90 %-94 %] 93 % (07/01 0930) Weight change:  Last BM Date: 08/22/15  Intake/Output from previous day: 06/30 0701 - 07/01 0700 In: 1590 [P.O.:990; IV Piggyback:600] Out: 0  Intake/Output this shift: Total I/O In: 120 [P.O.:120] Out: -   Labs:  Recent Labs  08/22/15 0526 08/24/15 0535  WBC 10.5 13.1*  HGB 11.6* 11.7*  HCT 35.4* 36.0*  PLT 394 519*    Recent Labs  08/23/15 1032 08/24/15 0535  NA 137 138  K 4.0 3.9  CL 101 102  CO2 29 29  GLUCOSE 100* 111*  BUN 9 8  CALCIUM 8.6* 8.7*    Studies/Results: Dg Chest Port 1 View  08/23/2015  CLINICAL DATA:  PICC line placement. EXAM: PORTABLE CHEST 1 VIEW COMPARISON:  CT 08/19/2015.  Chest x-ray 08/17/2015. FINDINGS: PICC line noted with tip at cavoatrial junction. Heart size normal. Multifocal pulmonary infiltrates are identified. These appear less prominent than on prior study of 08/19/2015. Small bilateral pleural effusions cannot be excluded. No pneumothorax. IMPRESSION: 1. PICC line noted with tip at cavoatrial junction. 2. Multifocal pulmonary infiltrates are again noted particularly prominent on the left. These appear slightly less prominent than on prior CT of 08/19/2015 . Electronically Signed   By: Maisie Fushomas  Register   On: 08/23/2015 16:03     PHYSICAL EXAM: Packing material removed without difficulty. No purulent discharge, minimal bleeding. Wound dressed with Bactroban ointment and 4 x 4 gauze.   Assessment/Plan: Stable from ENT perspective, packing removed without difficulty. He is scheduled  for discharge with intravenous vancomycin for MRSA. We discussed wound care precautions including gentle cleaning with hydrogen peroxide and application of Bactroban cream twice daily. He will apply dressing as needed, expect some bloody discharge over the next several days. Plan follow-up with Dr. Lazarus SalinesWolicki for recheck.    Kassidy Dockendorf 08/24/2015, 10:21 AM

## 2015-08-24 NOTE — Care Management (Signed)
CM met with patient to confirm [facility] Abx therapy with [facility], patient agreeable with discharge plan.   [place] with [facility] services confirmed,Vanc prescription faxed to  [facility] [phone]. CM discussed with Jonni Sanger RN on 6E, patient will receive next dose at 2p prior to discharge. [facility] will contact patient to arrange next dose at 10pm tonight, Patient made aware that [facility] nurse will contact him to arrange visit this evening. Updated Jonni Sanger RN on 6E. No further CM needs identified.

## 2015-08-24 NOTE — Discharge Summary (Signed)
Physician Discharge Summary  Chase Stuart TXM:[id number] DOB: 1970/03/02 DOA: 08/18/2015  PCP: No PCP Per Patient  Admit date: 08/18/2015 Discharge date: 08/24/2015  Time spent: 40 minutes  Recommendations for Outpatient Follow-up:  Patient will be discharged to home with [facility] to assist with IV antibiotics.  Patient will need to follow up with primary care provider within one week of discharge.  Patient should continue medications as prescribed.  Patient should follow a regular diet.  Take Tylenol or Ibuprofen as needed for pain. Gently clean wounds with hydrogen peroxide and application of Bactroban cream twice daily.   Discharge Diagnoses:  Principal Problem:   Sepsis due to methicillin resistant Staphylococcus aureus (MRSA) ([place]) Active Problems:   Pneumonia   Cellulitis of neck   Reactive cervical lymphadenopathy   MRSA bacteremia   Facial abscess   AKI (acute kidney injury) ([place])   Cavitary lesion of lung   Discharge Condition: Improved  Diet recommendation: Regular  Filed Weights   08/18/15 2028 08/19/15 2017 08/21/15 2237  Weight: 84.188 kg (185 lb 9.6 oz) 92 kg (202 lb 13.2 oz) 91.4 kg (201 lb 8 oz)    History of present illness:  Pt. admitted on 08/18/2015, with complaint of left-sided chest pain as well as facial swelling, was found to have cavitary pneumonia, cellulitis of the face. Blood cultures positive for MRSA. Infectious diseases was consulted. TTE as well as TEE are negative for any vegetation. ENT was consulted for incision and debridement of the local abscess. Repeat blood cultures negative for more than 48 hours and the PICC line order is placed. Currently further plan is continue IV antibiotics and identify the dose, follow recommendation from ENT for further debridement.  Hospital Course:  1. Sepsis due to methicillin resistant Staphylococcus aureus (MRSA) ([place])  Cellulitis of neck, Cavitary lung lesions MRSA bacteremia.  CT neck shows cervical  lymphadenopathy, tonsillar enlargement, inflammation of the skin and soft tissue of the neck as well as cavitary lesions of the lung. With tachycardia, fever, leukocytosis patient meeting sepsis criteria on admission.  Blood cultures on admission positive for MRSA, repeat blood cultures negative for 48 hours. Elevated ESR CRP, normal ANA ANCA. Infectious diseases consulted, appreciate input. TTE and TEE does not show any evidence of vegetation. Consulted ENT for neck abscess, highly appreciate their input. 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 Abscess Cultures are sent, growing MRSA  Antibiotic narrowed to vancomycin. Dose will be 1 gram q8h (goal vanc trough 15-20).  Duration of antibiotic is 2 weeks from 6/30, end date 7/13 as per ID. ENT removed packing without difficulty on 7/1 and discussed wound care. Ordered PICC line.  Patient's symptom onset was fairly rapid over last 3 days and patient mentions he was relatively healthy 3 days prior to admission. Denies any weight loss denies any night sweats denies any low-grade temperature. Denies any travel outside of [place] or exposure to patient with TB endemic countries.  HIV negative on 6/26. At this point differential for the cavitary lung lesion does not include tuberculosis.  2. Hypokalemia. Stable.  3. Anemia. No active bleeding, likely dilutional and chronic. Hgb stable at 11.7 at time of discharge.   4. Acute kidney injury. Serum creatinine on admission 1.44. Improved with IV hydration and treatment to 0.98 on discharge. Likely prerenal in the setting of infection.  ATN is also a possibility due to sepsis, but rapid improvement rules out that diagnosis.  5. Pleuritic chest pain. CT PE protocol is  negative for any pulmonary embolism. This is all likely in the setting of septic emboli.  6. Loose BM. 1 Add probiotics as inpatient.  7. Right heel pain. No evidence of swelling or  infection. Recommended to monitor.  8.  Gout Take Tylenol or Motrin as needed.  Procedures:  Echo  TEE  Abscess drainage by ENT  Consultations:  ID  Cardiology  ENT  Discharge Exam: Filed Vitals:   08/24/15 0534 08/24/15 0930  BP: 143/84 131/83  Pulse: 87 82  Temp: 98.5 F (36.9 C) 98.1 F (36.7 C)  Resp: 18 18     General: Well developed, well nourished, NAD, appears stated age  HEENT: NCAT, PERRLA, EOMI, Anicteic Sclera, mucous membranes moist.  Neck: Supple, no JVD, no masses  Cardiovascular: S1 S2 auscultated, no rubs, murmurs or gallops. Regular rate and rhythm.  Respiratory: Clear to auscultation bilaterally with equal chest rise  Abdomen: Soft, nontender, nondistended, + bowel sounds  Extremities: warm dry without cyanosis clubbing or edema  Neuro: AAOx3, cranial nerves grossly intact. Strength 5/5 in patient's upper and lower extremities bilaterally  Skin: Without rashes exudates or nodules  Psych: Normal affect and demeanor with intact judgement and insight  Discharge Instructions      Discharge Instructions    Call MD for:  difficulty breathing, headache or visual disturbances    Complete by:  As directed      Call MD for:  persistant nausea and vomiting    Complete by:  As directed      Call MD for:  redness, tenderness, or signs of infection (pain, swelling, redness, odor or green/yellow discharge around incision site)    Complete by:  As directed      Call MD for:  severe uncontrolled pain    Complete by:  As directed      Call MD for:  temperature >100.4    Complete by:  As directed      Diet - low sodium heart healthy    Complete by:  As directed      Discharge instructions    Complete by:  As directed   Patient will be discharged to home with [facility] to assist with IV antibiotics.  Patient will need to follow up with primary care provider within one week of discharge.  Patient should continue medications as prescribed.  Patient should  follow a regular diet.  Take Tylenol or Ibuprofen as needed for pain. Gently clean wounds with hydrogen peroxide and application of Bactroban cream twice daily.     Increase activity slowly    Complete by:  As directed             Medication List    TAKE these medications        guaiFENesin 600 MG 12 hr tablet  Commonly known as:  MUCINEX  Take 1 tablet (600 mg total) by mouth 2 (two) times daily.     mupirocin ointment 2 %  Commonly known as:  BACTROBAN  Place 1 application into the nose 2 (two) times daily.     vancomycin 1-5 GM/200ML-% Soln  Commonly known as:  VANCOCIN  Inject 200 mLs (1,000 mg total) into the vein every 8 (eight) hours.       No Known Allergies Follow-up Information    Follow up with Carlyle Basques, MD. Schedule an appointment as soon as possible for a visit in 2 weeks.   Specialty:  Infectious Diseases   Contact information:   [address] [place] [address] [phone]       Follow up with Jodi Marble, MD. Schedule an appointment as soon as possible for a visit in 10 days.   Specialty:  Otolaryngology   Why:  For wound re-check   Contact information:   [address] [place] [address] [phone]        The results of significant diagnostics from this hospitalization (including imaging, microbiology, ancillary and laboratory) are listed below for reference.    Significant Diagnostic Studies: Dg Chest 2 View  08/17/2015  CLINICAL DATA:  Shortness of breath and LEFT chest pain for 1 day. Family history of heart disease. EXAM: CHEST  2 VIEW COMPARISON:  None. FINDINGS: Cardiomediastinal silhouette is normal. The lungs are clear without pleural effusions or focal consolidations. Trachea projects midline and there is no pneumothorax. Mildly elevated RIGHT hemidiaphragm. Nodular density in projecting in LEFT upper chest is most consistent with confluence of bony shadows. Soft tissue planes and included  osseous structures are non-suspicious. IMPRESSION: No acute cardiopulmonary process. Electronically Signed   By: Elon Alas M.D.   On: 08/17/2015 02:18   Ct Soft Tissue Neck W Contrast  08/18/2015  CLINICAL DATA:  Neck swelling. Recent upper respiratory infection. Elevated white blood cell count. Evaluate for abscess. EXAM: CT NECK WITH CONTRAST TECHNIQUE: Multidetector CT imaging of the neck was performed using the standard protocol following the bolus administration of intravenous contrast. CONTRAST:  14m ISOVUE-300 IOPAMIDOL (ISOVUE-300) INJECTION 61% COMPARISON:  None. FINDINGS: Pharynx and larynx: There is symmetric, mild adenoid and moderate palatine tonsillar enlargement without a discrete pharyngeal mass identified. No peritonsillar or retropharyngeal fluid collection is seen. The larynx is unremarkable allowing for mild motion artifact. Salivary glands: The parotid and submandibular glands are unremarkable. There is moderate diffuse subcutaneous fat reticulation anterior and inferior to the mandible bilaterally with skin thickening and thickening of the platysma. Inflammatory change is greatest just right of midline in the submental region. No fluid collection is identified. Thyroid: Unremarkable. Lymph nodes: Submental lymph nodes measure up to 7 mm in short axis. There also subcentimeter submandibular lymph nodes bilaterally. Bilateral level II lymph nodes measure up to 10 mm in short axis, and there small bilateral level III and IV lymph nodes. These are all likely reactive. Vascular: Major vascular structures of the neck appear patent. Limited intracranial: Unremarkable. Visualized orbits: Unremarkable. Mastoids and visualized paranasal sinuses: Clear. Skeleton: Mild cervical spondylosis and lower cervical/ upper thoracic facet arthrosis. Upper chest: Multiple patchy/nodular opacities are partially visualized in the periphery of both upper lungs measuring up to 2 cm on the right and 3 cm on  the left. There is a 2.2 cm nodule in the peripheral left upper lobe with small focus of central cavitation. IMPRESSION: 1. Moderate subcutaneous inflammation in the jaw region suggestive of cellulitis. No abscess. 2. The mild cervical lymphadenopathy, likely reactive. 3. Moderate symmetric tonsillar enlargement without focal mass or abscess. 4. Partially visualized nodular opacities throughout both upper lungs with at least one small focus of cavitation. These are most likely infectious and could reflect bacterial pneumonia, fungal infection, or septic emboli. Electronically Signed   By: ALogan BoresM.D.   On: 08/18/2015 11:26   Ct Angio Chest Pe W Or Wo Contrast  08/19/2015  CLINICAL DATA:  Pleuritic chest pain. Acute hypoxemic respiratory failure. Cavitary lung lesion. EXAM: CT ANGIOGRAPHY CHEST WITH CONTRAST TECHNIQUE: Multidetector CT imaging of the chest was performed using the standard protocol during bolus  administration of intravenous contrast. Multiplanar CT image reconstructions and MIPs were obtained to evaluate the vascular anatomy. CONTRAST:  100 cc Isovue 370 IV COMPARISON:  CT cervical spine performed 08/18/2015. Chest x-ray 08/17/2015. FINDINGS: Mediastinum/Nodes: Heart is normal size. Aorta is normal caliber. No mediastinal, hilar, or axillary adenopathy. Lungs/Pleura: There are bar peripheral nodular airspace opacities noted within the upper lobes bilaterally, the largest being in the right upper lobe/ apex measuring up to 2.8 cm. Small locules of gas noted centrally suggesting early cavitation. Peripheral left upper lobe nodule with early cavitation noted on image 42 measuring 2 cm. Consolidation noted in the lingula and both lower lobes. Findings are concerning for infectious process, possibly multifocal pneumonia. Septic emboli cannot be excluded. Trace left effusion. Upper abdomen: Imaging into the upper abdomen shows no acute findings. Musculoskeletal: No acute bony abnormality. Chest  wall soft tissues are unremarkable. Review of the MIP images confirms the above findings. IMPRESSION: Areas of consolidation in the lingula and both lower lobes concerning for pneumonia. Peripheral nodular opacities in the upper lobes bilaterally, some with early cavitation. These are likely infectious as well. Cannot exclude septic emboli. Electronically Signed   By: Rolm Baptise M.D.   On: 08/19/2015 15:22   Dg Chest Port 1 View  08/23/2015  CLINICAL DATA:  PICC line placement. EXAM: PORTABLE CHEST 1 VIEW COMPARISON:  CT 08/19/2015.  Chest x-ray 08/17/2015. FINDINGS: PICC line noted with tip at cavoatrial junction. Heart size normal. Multifocal pulmonary infiltrates are identified. These appear less prominent than on prior study of 08/19/2015. Small bilateral pleural effusions cannot be excluded. No pneumothorax. IMPRESSION: 1. PICC line noted with tip at cavoatrial junction. 2. Multifocal pulmonary infiltrates are again noted particularly prominent on the left. These appear slightly less prominent than on prior CT of 08/19/2015 . Electronically Signed   By: Marcello Moores  Register   On: 08/23/2015 16:03    Microbiology: Recent Results (from the past 240 hour(s))  Culture, blood (routine x 2) Call MD if unable to obtain prior to antibiotics being given     Status: Abnormal   Collection Time: 08/19/15  1:48 PM  Result Value Ref Range Status   Specimen Description BLOOD RIGHT HAND  Final   Special Requests IN PEDIATRIC BOTTLE 1 ML  Final   Culture  Setup Time   Final    GRAM POSITIVE COCCI IN CLUSTERS IN PEDIATRIC BOTTLE CRITICAL RESULT CALLED TO, READ BACK BY AND VERIFIED WITH: Loleta Chance.D. 14:15 08/20/15  ([email or web address])    Culture METHICILLIN RESISTANT STAPHYLOCOCCUS AUREUS (A)  Final   Report Status 08/22/2015 FINAL  Final   Organism ID, Bacteria METHICILLIN RESISTANT STAPHYLOCOCCUS AUREUS  Final      Susceptibility   Methicillin resistant staphylococcus aureus - MIC*    CIPROFLOXACIN >=8  RESISTANT Resistant     ERYTHROMYCIN >=8 RESISTANT Resistant     GENTAMICIN <=0.5 SENSITIVE Sensitive     OXACILLIN >=4 RESISTANT Resistant     TETRACYCLINE <=1 SENSITIVE Sensitive     VANCOMYCIN <=0.5 SENSITIVE Sensitive     TRIMETH/SULFA <=10 SENSITIVE Sensitive     CLINDAMYCIN <=0.25 SENSITIVE Sensitive     RIFAMPIN <=0.5 SENSITIVE Sensitive     Inducible Clindamycin NEGATIVE Sensitive     * METHICILLIN RESISTANT STAPHYLOCOCCUS AUREUS  Blood Culture ID Panel (Reflexed)     Status: Abnormal   Collection Time: 08/19/15  1:48 PM  Result Value Ref Range Status   Enterococcus species NOT DETECTED NOT DETECTED Final   Vancomycin resistance NOT  DETECTED NOT DETECTED Final   Listeria monocytogenes NOT DETECTED NOT DETECTED Final   Staphylococcus species DETECTED (A) NOT DETECTED Final    Comment: CRITICAL RESULT CALLED TO, READ BACK BY AND VERIFIED WITH: CLamont Snowball.D. 14:15 08/20/15 ([email or web address])    Staphylococcus aureus DETECTED (A) NOT DETECTED Final    Comment: CRITICAL RESULT CALLED TO, READ BACK BY AND VERIFIED WITH: CLamont Snowball.D. 14:15 08/20/15 ([email or web address])    Methicillin resistance DETECTED (A) NOT DETECTED Final    Comment: CRITICAL RESULT CALLED TO, READ BACK BY AND VERIFIED WITH: CLamont Snowball.D. 14:15 08/20/15 ([email or web address])    Streptococcus species NOT DETECTED NOT DETECTED Final   Streptococcus agalactiae NOT DETECTED NOT DETECTED Final   Streptococcus pneumoniae NOT DETECTED NOT DETECTED Final   Streptococcus pyogenes NOT DETECTED NOT DETECTED Final   Acinetobacter baumannii NOT DETECTED NOT DETECTED Final   Enterobacteriaceae species NOT DETECTED NOT DETECTED Final   Enterobacter cloacae complex NOT DETECTED NOT DETECTED Final   Escherichia coli NOT DETECTED NOT DETECTED Final   Klebsiella oxytoca NOT DETECTED NOT DETECTED Final   Klebsiella pneumoniae NOT DETECTED NOT DETECTED Final   Proteus species NOT DETECTED NOT DETECTED Final   Serratia marcescens NOT DETECTED  NOT DETECTED Final   Carbapenem resistance NOT DETECTED NOT DETECTED Final   Haemophilus influenzae NOT DETECTED NOT DETECTED Final   Neisseria meningitidis NOT DETECTED NOT DETECTED Final   Pseudomonas aeruginosa NOT DETECTED NOT DETECTED Final   Candida albicans NOT DETECTED NOT DETECTED Final   Candida glabrata NOT DETECTED NOT DETECTED Final   Candida krusei NOT DETECTED NOT DETECTED Final   Candida parapsilosis NOT DETECTED NOT DETECTED Final   Candida tropicalis NOT DETECTED NOT DETECTED Final  Culture, blood (routine x 2) Call MD if unable to obtain prior to antibiotics being given     Status: None (Preliminary result)   Collection Time: 08/19/15  1:56 PM  Result Value Ref Range Status   Specimen Description BLOOD LEFT ARM  Final   Special Requests IN PEDIATRIC BOTTLE 1.0 CC  Final   Culture NO GROWTH 4 DAYS  Final   Report Status PENDING  Incomplete  Culture, blood (routine x 2)     Status: None (Preliminary result)   Collection Time: 08/20/15  3:08 PM  Result Value Ref Range Status   Specimen Description BLOOD RIGHT ARM  Final   Special Requests BOTTLES DRAWN AEROBIC AND ANAEROBIC 10CC  Final   Culture NO GROWTH 3 DAYS  Final   Report Status PENDING  Incomplete  Culture, blood (routine x 2)     Status: None (Preliminary result)   Collection Time: 08/20/15  3:13 PM  Result Value Ref Range Status   Specimen Description BLOOD RIGHT ARM  Final   Special Requests BOTTLES DRAWN AEROBIC AND ANAEROBIC 10CC  Final   Culture NO GROWTH 3 DAYS  Final   Report Status PENDING  Incomplete  Surgical pcr screen     Status: Abnormal   Collection Time: 08/22/15  2:04 AM  Result Value Ref Range Status   MRSA, PCR POSITIVE (A) NEGATIVE Final    Comment: RESULT CALLED TO, READ BACK BY AND VERIFIED WITH: K MOORE,RN @0600  08/22/15 MKELLY    Staphylococcus aureus POSITIVE (A) NEGATIVE Final    Comment:        The Xpert SA Assay (FDA approved for NASAL specimens in patients over 21 years of  age), is one component of a comprehensive surveillance program.  Test performance has been validated  by [facility] for patients greater than or equal to 32 year old. It is not intended to diagnose infection nor to guide or monitor treatment.   Aerobic/Anaerobic Culture (surgical/deep wound)     Status: None (Preliminary result)   Collection Time: 08/22/15 10:17 AM  Result Value Ref Range Status   Specimen Description ABSCESS  Final   Special Requests  CHIN AREA MENTAL ABSCESS  Final   Gram Stain   Final    FEW WBC PRESENT,BOTH PMN AND MONONUCLEAR FEW GRAM POSITIVE COCCI IN CLUSTERS    Culture FEW METHICILLIN RESISTANT STAPHYLOCOCCUS AUREUS  Final   Report Status PENDING  Incomplete   Organism ID, Bacteria METHICILLIN RESISTANT STAPHYLOCOCCUS AUREUS  Final      Susceptibility   Methicillin resistant staphylococcus aureus - MIC*    CIPROFLOXACIN >=8 RESISTANT Resistant     ERYTHROMYCIN >=8 RESISTANT Resistant     GENTAMICIN <=0.5 SENSITIVE Sensitive     OXACILLIN >=4 RESISTANT Resistant     TETRACYCLINE <=1 SENSITIVE Sensitive     VANCOMYCIN <=0.5 SENSITIVE Sensitive     TRIMETH/SULFA <=10 SENSITIVE Sensitive     CLINDAMYCIN <=0.25 SENSITIVE Sensitive     RIFAMPIN <=0.5 SENSITIVE Sensitive     Inducible Clindamycin NEGATIVE Sensitive     * FEW METHICILLIN RESISTANT STAPHYLOCOCCUS AUREUS     Labs: Basic Metabolic Panel:  Recent Labs Lab 08/20/15 0557 08/21/15 0550 08/22/15 0526 08/23/15 1032 08/24/15 0535  NA 135 133* 138 137 138  K 3.5 3.1* 3.7 4.0 3.9  CL 102 102 103 101 102  CO2 25 24 27 29 29   GLUCOSE 123* 113* 95 100* 111*  BUN 9 8 8 9 8   CREATININE 1.11 0.81 0.85 0.65 0.98  CALCIUM 8.4* 7.9* 8.7* 8.6* 8.7*  MG 2.0  --  2.0  --   --    Liver Function Tests:  Recent Labs Lab 08/20/15 0557  AST 16  ALT 21  ALKPHOS 76  BILITOT 0.7  PROT 5.8*  ALBUMIN 2.5*   No results for input(s): LIPASE, AMYLASE in the last 168 hours. No results for input(s):  AMMONIA in the last 168 hours. CBC:  Recent Labs Lab 08/18/15 1015  08/19/15 0803 08/20/15 0557 08/21/15 0550 08/22/15 0526 08/24/15 0535  WBC 11.9*  < > 12.5* 13.1* 11.3* 10.5 13.1*  NEUTROABS 9.1*  --   --  10.8*  --   --  9.9*  HGB 14.0  < > 12.4* 11.9* 10.7* 11.6* 11.7*  HCT 40.2  < > 37.2* 34.7* 32.1* 35.4* 36.0*  MCV 91.6  < > 90.1 91.6 88.9 91.9 93.8  PLT 219  < > 216 211 275 394 519*  < > = values in this interval not displayed. Cardiac Enzymes: No results for input(s): CKTOTAL, CKMB, CKMBINDEX, TROPONINI in the last 168 hours. BNP: BNP (last 3 results) No results for input(s): BNP in the last 8760 hours.  ProBNP (last 3 results) No results for input(s): PROBNP in the last 8760 hours.  CBG: No results for input(s): GLUCAP in the last 168 hours.     Signed:  Karmen Bongo  [facility] 08/24/2015, 11:12 AM

## 2015-08-24 NOTE — Progress Notes (Signed)
[facility]  Patient Status: New pt with [facility] this admission  [facility] is providing the following services: HHRN and Home Infusion Pharmacy for home IV ABX.  Hands on teaching with pt yesterday on IV Vancomycin administration and he did extremely well and will independent at home. [facility].  [facility] will need Vancomycin script before DC. [facility] weekend team available to help.  Please call Tiffany.   If patient discharges after hours, please call [phone].   Sedalia Mutaamela S Chandler 08/24/2015, 8:16 AM

## 2015-08-25 ENCOUNTER — Encounter (HOSPITAL_COMMUNITY): Payer: Self-pay | Admitting: Cardiology

## 2015-08-25 LAB — CULTURE, BLOOD (ROUTINE X 2)
Culture: NO GROWTH
Culture: NO GROWTH

## 2015-08-27 LAB — AEROBIC/ANAEROBIC CULTURE W GRAM STAIN (SURGICAL/DEEP WOUND)

## 2015-08-27 LAB — AEROBIC/ANAEROBIC CULTURE (SURGICAL/DEEP WOUND)

## 2015-09-03 ENCOUNTER — Telehealth: Payer: Self-pay | Admitting: *Deleted

## 2015-09-03 NOTE — Telephone Encounter (Signed)
Call from Amy, home health nurse with [facility] to let Dr. Drue SecondSnider know that the vancomycin trough will be drawn today and results faxed tomorrow to this clinic. Chase Stuart

## 2015-09-05 ENCOUNTER — Telehealth: Payer: Self-pay

## 2015-09-05 NOTE — Telephone Encounter (Signed)
[facility] nurse Amy V. called wanting orders on how to proceed with patient. She stated patient antibiotic ended today and she did not know if PICC should be pulled. LPN to send message to Dr. Snider for oDrue Secondrders. Will call nurse Amy back once orders are given. Rejeana Brockandace Burhan Barham, LPN

## 2015-09-07 ENCOUNTER — Other Ambulatory Visit: Payer: Self-pay | Admitting: Internal Medicine

## 2015-09-07 DIAGNOSIS — A4902 Methicillin resistant Staphylococcus aureus infection, unspecified site: Secondary | ICD-10-CM

## 2015-09-07 MED ORDER — DOXYCYCLINE HYCLATE 100 MG PO TABS: 100.0000 mg | ORAL_TABLET | Freq: Two times a day (BID) | ORAL | Status: AC

## 2015-09-07 NOTE — Telephone Encounter (Signed)
i have spoken to [facility] this morning and gave verbal to pull picc line. I have sent in 2 wk course of doxy to finish up treatment

## 2015-09-18 ENCOUNTER — Ambulatory Visit (INDEPENDENT_AMBULATORY_CARE_PROVIDER_SITE_OTHER): Payer: BLUE CROSS/BLUE SHIELD | Admitting: Internal Medicine

## 2015-09-18 ENCOUNTER — Encounter: Payer: Self-pay | Admitting: Internal Medicine

## 2015-09-18 VITALS — BP 121/85 | HR 84 | Temp 98.0°F | Wt 182.0 lb

## 2015-09-18 DIAGNOSIS — L0201 Cutaneous abscess of face: Secondary | ICD-10-CM

## 2015-09-18 DIAGNOSIS — A4902 Methicillin resistant Staphylococcus aureus infection, unspecified site: Secondary | ICD-10-CM | POA: Diagnosis not present

## 2015-09-18 DIAGNOSIS — R0781 Pleurodynia: Secondary | ICD-10-CM | POA: Diagnosis not present

## 2015-09-18 LAB — CBC WITH DIFFERENTIAL/PLATELET
BASOS ABS: 65 {cells}/uL (ref 0–200)
Basophils Relative: 1 %
Eosinophils Absolute: 390 cells/uL (ref 15–500)
Eosinophils Relative: 6 %
HEMATOCRIT: 42.8 % (ref 38.5–50.0)
HEMOGLOBIN: 14.8 g/dL (ref 13.2–17.1)
LYMPHS ABS: 2665 {cells}/uL (ref 850–3900)
Lymphocytes Relative: 41 %
MCH: 31.4 pg (ref 27.0–33.0)
MCHC: 34.6 g/dL (ref 32.0–36.0)
MCV: 90.7 fL (ref 80.0–100.0)
MONO ABS: 520 {cells}/uL (ref 200–950)
MPV: 9.4 fL (ref 7.5–12.5)
Monocytes Relative: 8 %
NEUTROS PCT: 44 %
Neutro Abs: 2860 cells/uL (ref 1500–7800)
Platelets: 356 10*3/uL (ref 140–400)
RBC: 4.72 MIL/uL (ref 4.20–5.80)
RDW: 13.2 % (ref 11.0–15.0)
WBC: 6.5 10*3/uL (ref 3.8–10.8)

## 2015-09-18 LAB — C-REACTIVE PROTEIN: CRP: <0.5 mg/dL (ref <0.60)

## 2015-09-18 NOTE — Progress Notes (Signed)
RFV: hospital follow up for MRSA infection Subjective:    Patient ID: Chase Stuart, male    DOB: [birth year], 45 y.o.   MRN: [id number]  HPI Chase Stuart is a 45yo M in general good state of health until late June when he started to have submandibular swelling, sore throat, fever, furuncle on his right side of chin. His symptoms progressed and went to ED for evaluation. In ED, not only found to have fevers, leukocytosis, but CXR showed cavitary pulmonary lesions. His infectious work up it reveal MRSA bacteremia. He underwent evaluation for endocarditis for which TEE was negative. His clinical picture thought to have deep tissue facial abscess, likely from MRSA that seeded blood -> caused septic pulmonary emboli. He had associated pleuretic chest pain which have now completely resolved. He did require I x D of deep tissue abscess. He was treated with 14 days of IV abtx, using 6/30 as day 1 then placed on 2 additional weeks of doxycycline for which he is nearly completed. He feels that he is back to his usual state of health. His incision from I x D has completely healed. Still has some submandibular swelling. No change in exercise tolerance, no longer has any pleuretic chest pain.  Old micro data:  6/27 blood cx ngtd 6/26 blood cx MRSA in 1 of 2 sets 6/29 wound cx MRSA  No Known Allergies Current Outpatient Prescriptions on File Prior to Visit  Medication Sig Dispense Refill  . doxycycline (VIBRA-TABS) 100 MG tablet Take 1 tablet (100 mg total) by mouth 2 (two) times daily. On full stomach 28 tablet 0  . mupirocin ointment (BACTROBAN) 2 % Place 1 application into the nose 2 (two) times daily. 22 g 0  . guaiFENesin (MUCINEX) 600 MG 12 hr tablet Take 1 tablet (600 mg total) by mouth 2 (two) times daily. (Patient not taking: Reported on 09/18/2015) 30 tablet 0   No current facility-administered medications on file prior to visit.    Active Ambulatory Problems    Diagnosis Date Noted  .  Pneumonia 08/18/2015  . Cellulitis and abscess of neck 08/18/2015  . Cellulitis of neck 08/18/2015  . Reactive cervical lymphadenopathy 08/18/2015  . MRSA bacteremia 08/20/2015  . Sepsis due to methicillin resistant Staphylococcus aureus (MRSA) (HCC) 08/21/2015  . Facial abscess 08/22/2015  . AKI (acute kidney injury) (HCC) 08/23/2015  . Cavitary lesion of lung   . Pleuritic chest pain    Resolved Ambulatory Problems    Diagnosis Date Noted  . No Resolved Ambulatory Problems   No Additional Past Medical History   Social History  Substance Use Topics  . Smoking status: Never Smoker  . Smokeless tobacco: Never Used  . Alcohol use Yes    Family hx: no history of immune compromised disorders.  Review of Systems  Constitutional: Negative for fever, chills, diaphoresis, activity change, appetite change, fatigue and unexpected weight change.  HENT: Negative for congestion, sore throat, rhinorrhea, sneezing, trouble swallowing and sinus pressure.  Eyes: Negative for photophobia and visual disturbance.  Respiratory: Negative for cough, chest tightness, shortness of breath, wheezing and stridor.  Cardiovascular: Negative for chest pain, palpitations and leg swelling.  Gastrointestinal: Negative for nausea, vomiting, abdominal pain, diarrhea, constipation, blood in stool, abdominal distention and anal bleeding.  Genitourinary: Negative for dysuria, hematuria, flank pain and difficulty urinating.  Musculoskeletal: Negative for myalgias, back pain, joint swelling, arthralgias and gait problem.  Skin: Negative for color change, pallor, rash and wound.  Neurological: Negative for dizziness, tremors, weakness  and light-headedness.  Hematological: Negative for adenopathy. Does not bruise/bleed easily.  Psychiatric/Behavioral: Negative for behavioral problems, confusion, sleep disturbance, dysphoric mood, decreased concentration and agitation.       Objective:   Physical Exam BP 121/85 (BP  Location: Left Arm, Patient Position: Sitting, Cuff Size: Normal)   Pulse 84   Temp 98 F (36.7 C) (Oral)   Wt 182 lb (82.6 kg)   BMI 25.38 kg/m  Physical Exam  Constitutional: He is oriented to person, place, and time. He appears well-developed and well-nourished. No distress.  HENT:  Mouth/Throat: Oropharynx is clear and moist. No oropharyngeal exudate.  Cardiovascular: Normal rate, regular rhythm and normal heart sounds. Exam reveals no gallop and no friction rub.  No murmur heard.  Pulmonary/Chest: Effort normal and breath sounds normal. No respiratory distress. He has no wheezes.  Abdominal: Soft. Bowel sounds are normal. He exhibits no distension. There is no tenderness.  Lymphadenopathy:  He has no cervical adenopathy.  Neurological: He is alert and oriented to person, place, and time.  Skin: Skin is warm and dry. No rash noted. No erythema.  Psychiatric: He has a normal mood and affect. His behavior is normal.    Lab Results  Component Value Date   ESRSEDRATE 48 (H) 08/19/2015   Lab Results  Component Value Date   CRP 45.7 (H) 08/19/2015       Assessment & Plan:    Complicated MRSA bacteremia with pulmonary septic emboli plus deep tissue facial abscess s/p debridement. He has finished IV vancomycin for 14 days. Now completing 14 days of doxycycline for residual tx for septic emboli and deep tissue abscess of chin  Deep tissue abscess to face = appreciate dr. Lazarus Salines for doing debridement of submandibular/submental lesion.  Will  Check sed rate and crp. If still elevated, will give 2 addn week of doxycycline. Otherwise, if labs have normalized. Then we will complete course within next 3 days  pleuretic chest pain/pneumonia = now resolved  rtc prn

## 2015-09-19 LAB — SEDIMENTATION RATE: SED RATE: 1 mm/h (ref 0–15)

## 2016-12-06 IMAGING — CT CT ANGIO CHEST
2 of 6 series · 18 of 36 positions shown · IV contrast (Omni 300)
Comparison: CT cervical spine performed 08/18/2015. Chest x-ray
08/17/2015.

CLINICAL DATA: Pleuritic chest pain. Acute hypoxemic respiratory
failure. Cavitary lung lesion.

EXAM:
CT ANGIOGRAPHY CHEST WITH CONTRAST
TECHNIQUE: Multidetector CT imaging of the chest was performed using the
standard protocol during bolus administration of intravenous
contrast. Multiplanar CT image reconstructions and MIPs were
obtained to evaluate the vascular anatomy.
CONTRAST:  100 cc Isovue 370 IV

[Series 6: pe thins · axial · 0.70mm/px · z∈[-70,+197]mm · 17 of 301 slices shown]
[im 17/301  lung]
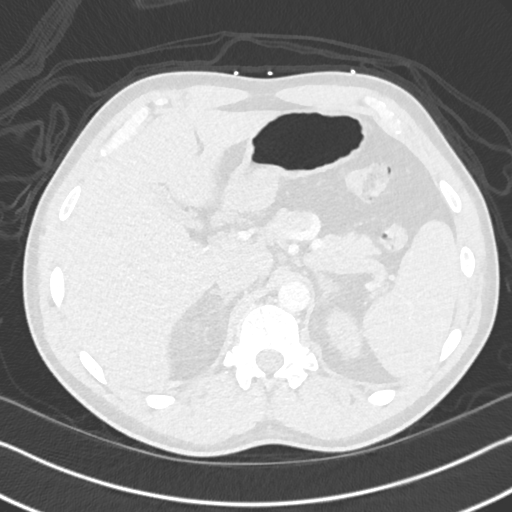
[im 34/301  mediastinal]
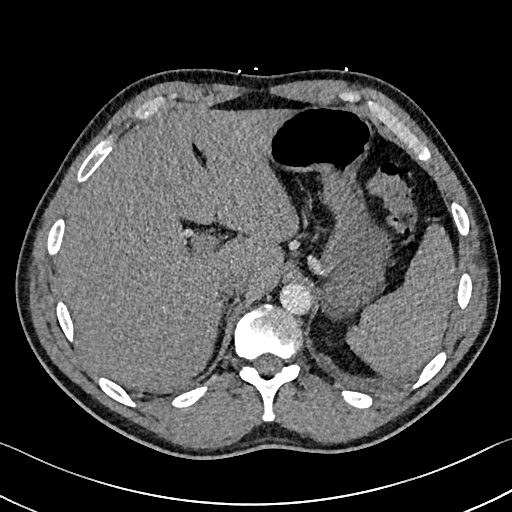
[im 51/301  lung]
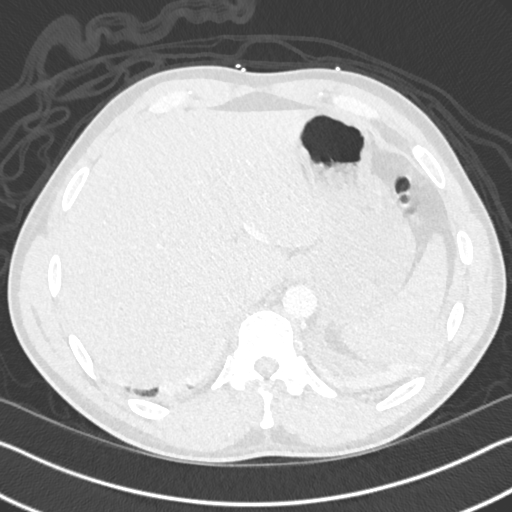
[im 67/301  mediastinal]
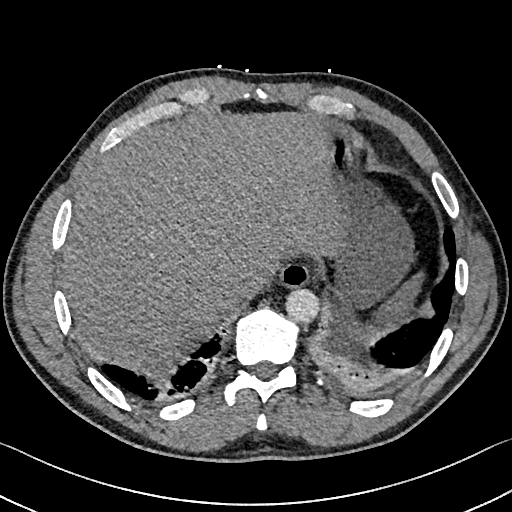
[im 84/301  lung]
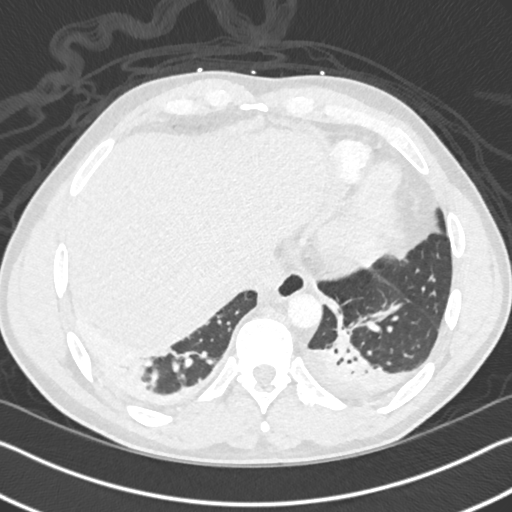
[im 101/301  mediastinal]
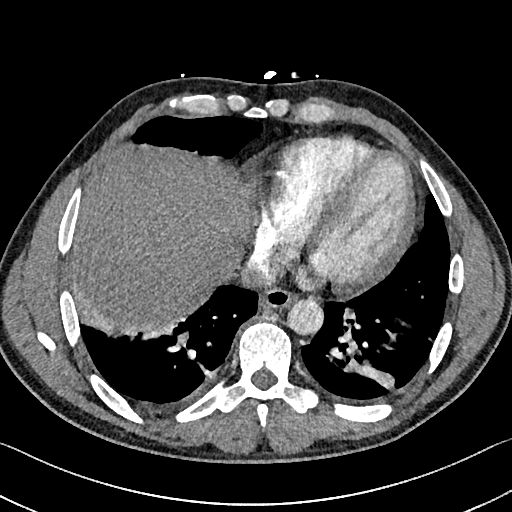
[im 117/301  lung]
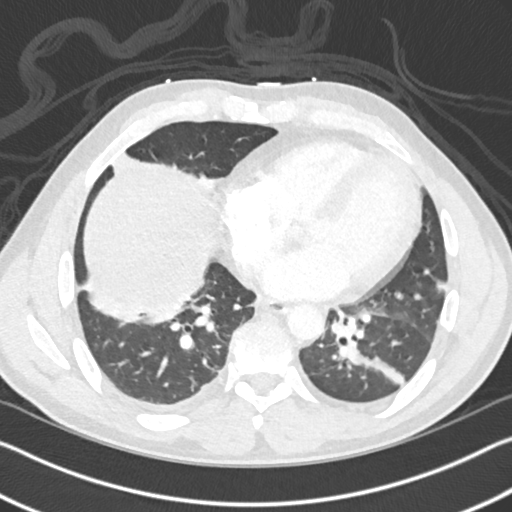
[im 134/301  mediastinal]
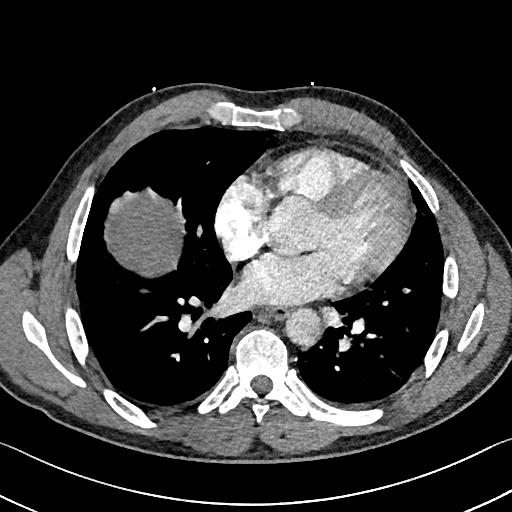
[im 151/301  lung]
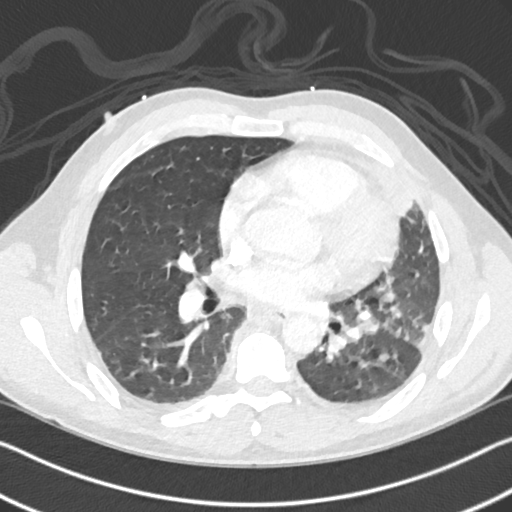
[im 167/301  mediastinal]
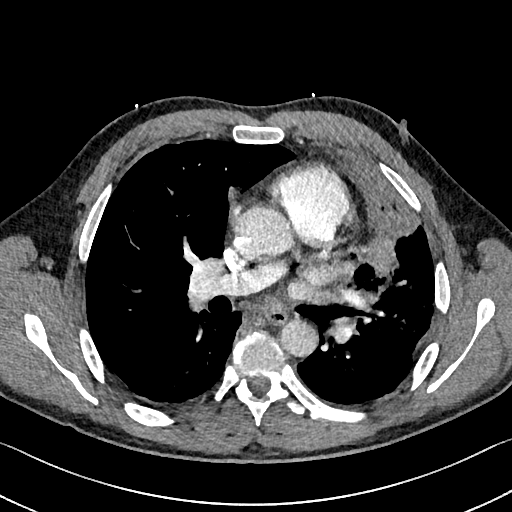
[im 184/301  lung]
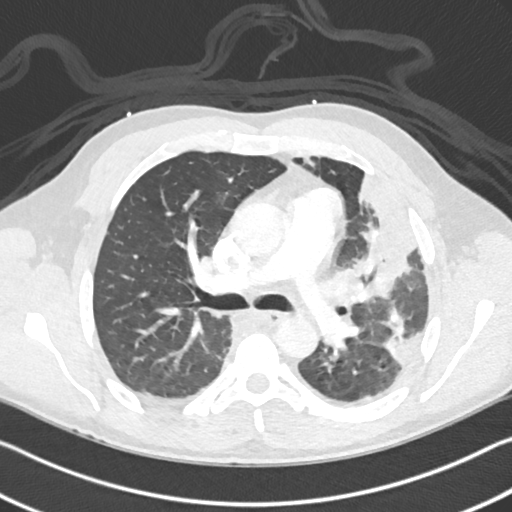
[im 201/301  mediastinal]
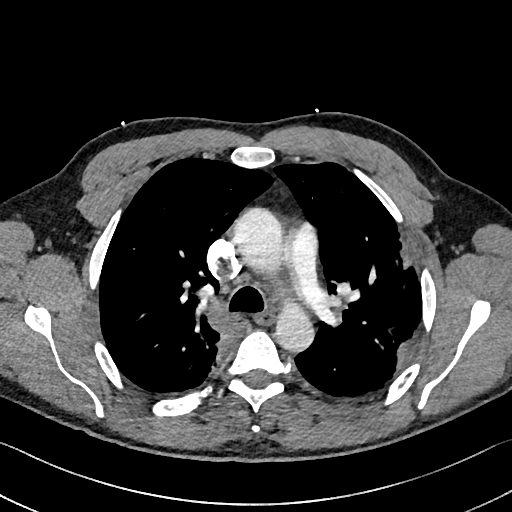
[im 217/301  lung]
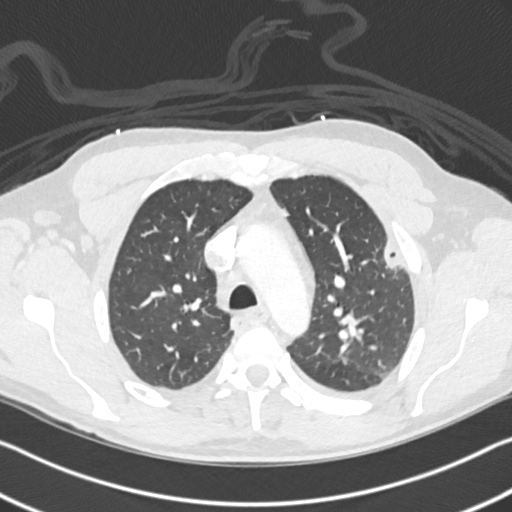
[im 234/301  mediastinal]
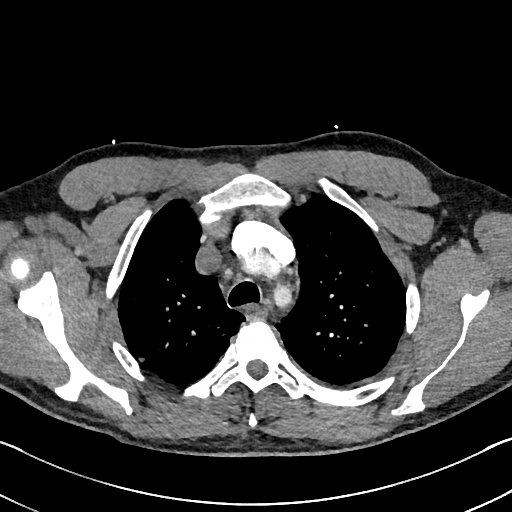
[im 251/301  lung]
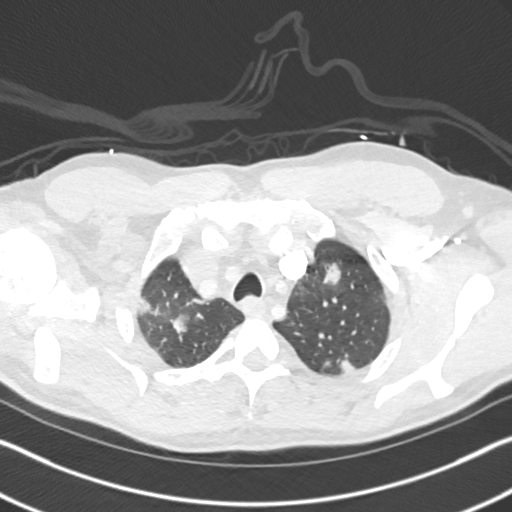
[im 267/301  mediastinal]
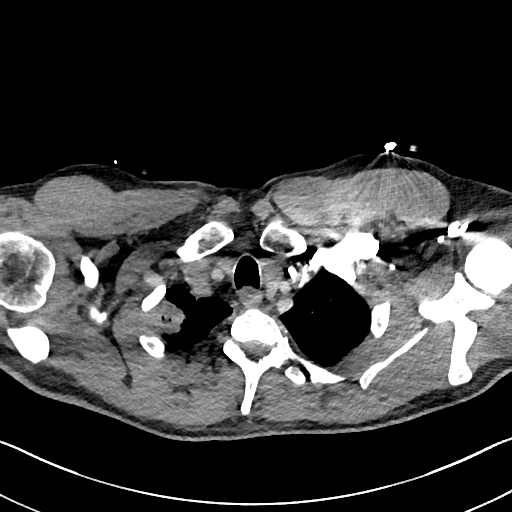
[im 284/301  lung]
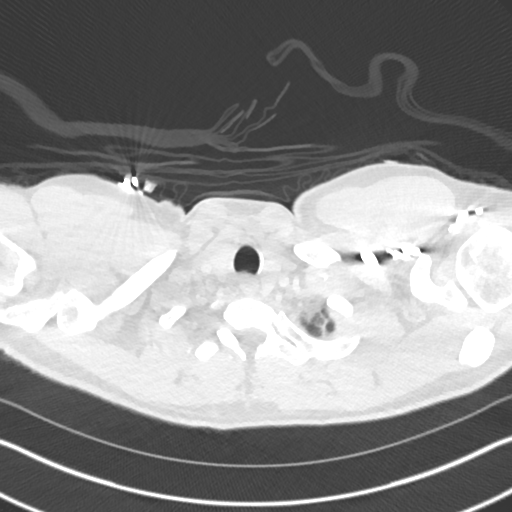

[Series 7: pe 2mm cor · coronal · 0.65mm/px · 1 of 151 slices shown]
[im 76/151  mediastinal]
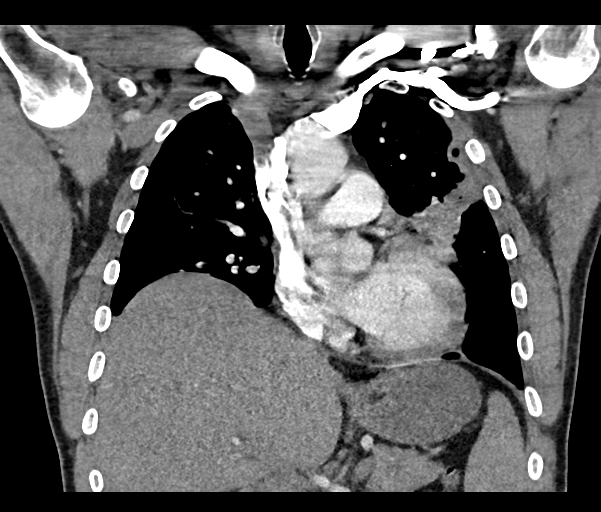

[18 of 36 positions shown; findings below may reference images not displayed]

FINDINGS: Mediastinum/Nodes: Heart is normal size. Aorta is normal caliber. No
mediastinal, hilar, or axillary adenopathy.

Lungs/Pleura: There are bar peripheral nodular airspace opacities
noted within the upper lobes bilaterally, the largest being in the
right upper lobe/ apex measuring up to 2.8 cm. Small locules of gas
noted centrally suggesting early cavitation. Peripheral left upper
lobe nodule with early cavitation noted on image 42 measuring 2 cm.
Consolidation noted in the lingula and both lower lobes. Findings
are concerning for infectious process, possibly multifocal
pneumonia. Septic emboli cannot be excluded. Trace left effusion.

Upper abdomen: Imaging into the upper abdomen shows no acute
findings.

Musculoskeletal: No acute bony abnormality. Chest wall soft tissues
are unremarkable.

Review of the MIP images confirms the above findings.
IMPRESSION: Areas of consolidation in the lingula and both lower lobes
concerning for pneumonia.

Peripheral nodular opacities in the upper lobes bilaterally, some
with early cavitation. These are likely infectious as well. Cannot
exclude septic emboli.
